# Patient Record
Sex: Female | Born: 1975
Health system: Southern US, Community
[De-identification: ages and names within clinical notes are randomized; demographics above are authoritative.]

## PROBLEM LIST (undated history)

## (undated) DIAGNOSIS — R002 Palpitations: Secondary | ICD-10-CM

## (undated) DIAGNOSIS — R000 Tachycardia, unspecified: Secondary | ICD-10-CM

## (undated) DIAGNOSIS — D649 Anemia, unspecified: Secondary | ICD-10-CM

## (undated) DIAGNOSIS — K219 Gastro-esophageal reflux disease without esophagitis: Secondary | ICD-10-CM

## (undated) DIAGNOSIS — T4145XA Adverse effect of unspecified anesthetic, initial encounter: Secondary | ICD-10-CM

## (undated) DIAGNOSIS — R112 Nausea with vomiting, unspecified: Secondary | ICD-10-CM

## (undated) DIAGNOSIS — Z9889 Other specified postprocedural states: Secondary | ICD-10-CM

## (undated) DIAGNOSIS — Z8489 Family history of other specified conditions: Secondary | ICD-10-CM

## (undated) DIAGNOSIS — T8859XA Other complications of anesthesia, initial encounter: Secondary | ICD-10-CM

## (undated) HISTORY — PX: ABDOMINAL HYSTERECTOMY: SHX81

## (undated) HISTORY — PX: OOPHORECTOMY: SHX86

---

## 2005-06-19 ENCOUNTER — Emergency Department: Payer: Self-pay | Admitting: Emergency Medicine

## 2005-07-20 ENCOUNTER — Ambulatory Visit: Payer: Self-pay

## 2005-08-08 ENCOUNTER — Observation Stay: Payer: Self-pay

## 2005-10-05 ENCOUNTER — Observation Stay: Payer: Self-pay | Admitting: Obstetrics & Gynecology

## 2005-10-10 ENCOUNTER — Inpatient Hospital Stay: Payer: Self-pay | Admitting: Obstetrics & Gynecology

## 2006-11-06 ENCOUNTER — Ambulatory Visit: Payer: Self-pay

## 2007-01-22 ENCOUNTER — Inpatient Hospital Stay: Payer: Self-pay

## 2015-01-15 ENCOUNTER — Encounter
Admission: RE | Admit: 2015-01-15 | Discharge: 2015-01-15 | Disposition: A | Discharge status: Home / Self Care | Payer: BLUE CROSS/BLUE SHIELD | Source: Ambulatory Visit | Attending: Obstetrics & Gynecology | Admitting: Obstetrics & Gynecology

## 2015-01-15 DIAGNOSIS — Z882 Allergy status to sulfonamides status: Secondary | ICD-10-CM | POA: Diagnosis not present

## 2015-01-15 DIAGNOSIS — Z880 Allergy status to penicillin: Secondary | ICD-10-CM | POA: Insufficient documentation

## 2015-01-15 DIAGNOSIS — Z3200 Encounter for pregnancy test, result unknown: Secondary | ICD-10-CM | POA: Insufficient documentation

## 2015-01-15 DIAGNOSIS — Z01812 Encounter for preprocedural laboratory examination: Secondary | ICD-10-CM | POA: Insufficient documentation

## 2015-01-15 DIAGNOSIS — Z881 Allergy status to other antibiotic agents status: Secondary | ICD-10-CM | POA: Insufficient documentation

## 2015-01-15 DIAGNOSIS — Z886 Allergy status to analgesic agent status: Secondary | ICD-10-CM | POA: Insufficient documentation

## 2015-01-15 LAB — ABO/RH: ABO/RH(D): A POS

## 2015-01-15 LAB — CBC
HCT: 33 % — ABNORMAL LOW (ref 35.0–47.0)
Hemoglobin: 10.4 g/dL — ABNORMAL LOW (ref 12.0–16.0)
MCH: 23.8 pg — ABNORMAL LOW (ref 26.0–34.0)
MCHC: 31.4 g/dL — ABNORMAL LOW (ref 32.0–36.0)
MCV: 75.9 fL — ABNORMAL LOW (ref 80.0–100.0)
PLATELETS: 308 10*3/uL (ref 150–440)
RBC: 4.35 MIL/uL (ref 3.80–5.20)
RDW: 16 % — ABNORMAL HIGH (ref 11.5–14.5)
WBC: 4.3 10*3/uL (ref 3.6–11.0)

## 2015-01-15 LAB — TYPE AND SCREEN
ABO/RH(D): A POS
Antibody Screen: NEGATIVE

## 2015-01-15 NOTE — Patient Instructions (Signed)
  Your procedure is scheduled on: Thursday January 29, 2015. Report to Same Day Surgery. To find out your arrival time please call [phone] between 1PM - 3PM on Wednesday July, 6 2016.  Remember: Instructions that are not followed completely may result in serious medical risk, up to and including death, or upon the discretion of your surgeon and anesthesiologist your surgery may need to be rescheduled.    _x___ 1. Do not eat food or drink liquids after midnight. No gum chewing or hard candies.     ____ 2. No Alcohol for 24 hours before or after surgery.   ____ 3. Bring all medications with you on the day of surgery if instructed.    _x___ 4. Notify your doctor if there is any change in your medical condition     (cold, fever, infections).     Do not wear jewelry, make-up, hairpins, clips or nail polish.  Do not wear lotions, powders, or perfumes. You may wear deodorant.  Do not shave 48 hours prior to surgery. Men may shave face and neck.  Do not bring valuables to the hospital.    [facility] is not responsible for any belongings or valuables.               Contacts, dentures or bridgework may not be worn into surgery.  Leave your suitcase in the car. After surgery it may be brought to your room.  For patients admitted to the hospital, discharge time is determined by your treatment team.   Patients discharged the day of surgery will not be allowed to drive home.    Please read over the following fact sheets that you were given:   [facility] Preparing for Surgery  __x__ Take these medicines the morning of surgery with A SIP OF WATER: None    ____ Fleet Enema (as directed)   __x__ Use CHG Soap as directed  ____ Use inhalers on the day of surgery  ____ Stop metformin 2 days prior to surgery    ____ Take 1/2 of usual insulin dose the night before surgery and none on the morning of surgery.   ____ Stop Coumadin/Plavix/aspirin on does not apply.  _x___ Stop  Anti-inflammatories (Ibuprofen) on 01/23/2015   ____ Stop supplements until after surgery.    ____ Bring C-Pap to the hospital.

## 2015-01-29 ENCOUNTER — Encounter
Admission: RE | Disposition: A | Discharge status: Home / Self Care | Payer: Self-pay | Source: Ambulatory Visit | Attending: Obstetrics & Gynecology

## 2015-01-29 ENCOUNTER — Ambulatory Visit: Payer: BLUE CROSS/BLUE SHIELD | Admitting: Anesthesiology

## 2015-01-29 ENCOUNTER — Encounter: Payer: Self-pay | Admitting: *Deleted

## 2015-01-29 ENCOUNTER — Observation Stay
Admission: RE | Admit: 2015-01-29 | Discharge: 2015-01-30 | Disposition: A | Discharge status: Home / Self Care | Payer: BLUE CROSS/BLUE SHIELD | Source: Ambulatory Visit | Attending: Obstetrics & Gynecology | Admitting: Obstetrics & Gynecology

## 2015-01-29 DIAGNOSIS — Z90710 Acquired absence of both cervix and uterus: Secondary | ICD-10-CM | POA: Diagnosis present

## 2015-01-29 DIAGNOSIS — R102 Pelvic and perineal pain: Secondary | ICD-10-CM | POA: Insufficient documentation

## 2015-01-29 DIAGNOSIS — N921 Excessive and frequent menstruation with irregular cycle: Secondary | ICD-10-CM | POA: Diagnosis present

## 2015-01-29 DIAGNOSIS — N800 Endometriosis of uterus: Principal | ICD-10-CM | POA: Insufficient documentation

## 2015-01-29 DIAGNOSIS — N838 Other noninflammatory disorders of ovary, fallopian tube and broad ligament: Secondary | ICD-10-CM | POA: Diagnosis not present

## 2015-01-29 DIAGNOSIS — N809 Endometriosis, unspecified: Secondary | ICD-10-CM | POA: Diagnosis present

## 2015-01-29 HISTORY — PX: LAPAROSCOPIC BILATERAL SALPINGECTOMY: SHX5889

## 2015-01-29 HISTORY — PX: CYSTOSCOPY: SHX5120

## 2015-01-29 HISTORY — PX: LAPAROSCOPIC HYSTERECTOMY: SHX1926

## 2015-01-29 LAB — POCT PREGNANCY, URINE: Preg Test, Ur: NEGATIVE

## 2015-01-29 SURGERY — HYSTERECTOMY, TOTAL, LAPAROSCOPIC
Anesthesia: General | Site: Bladder

## 2015-01-29 MED ORDER — ONDANSETRON HCL 4 MG/2ML IJ SOLN: 4.0000 mg | Freq: Once | INTRAMUSCULAR | Status: DC | PRN

## 2015-01-29 MED ORDER — KETOROLAC TROMETHAMINE 30 MG/ML IJ SOLN
30.0000 mg | Freq: Four times a day (QID) | INTRAMUSCULAR | Status: DC
  Administered 2015-01-29: 30 mg via INTRAVENOUS

## 2015-01-29 MED ORDER — OXYCODONE-ACETAMINOPHEN 5-325 MG PO TABS
1.0000 | ORAL_TABLET | ORAL | Status: DC | PRN
  Administered 2015-01-30 (×2): 1 via ORAL
  Filled 2015-01-29 (×2): qty 1

## 2015-01-29 MED ORDER — CLINDAMYCIN PHOSPHATE 900 MG/50ML IV SOLN
900.0000 mg | Freq: Once | INTRAVENOUS | Status: AC
  Administered 2015-01-29: 900 mg via INTRAVENOUS

## 2015-01-29 MED ORDER — BUPIVACAINE HCL (PF) 0.5 % IJ SOLN
INTRAMUSCULAR | Status: AC
  Filled 2015-01-29: qty 30

## 2015-01-29 MED ORDER — ACETAMINOPHEN 325 MG PO TABS: 650.0000 mg | ORAL_TABLET | ORAL | Status: DC | PRN

## 2015-01-29 MED ORDER — FAMOTIDINE 20 MG PO TABS
ORAL_TABLET | ORAL | Status: AC
  Administered 2015-01-29: 20 mg via ORAL
  Filled 2015-01-29: qty 1

## 2015-01-29 MED ORDER — CLINDAMYCIN PHOSPHATE 900 MG/50ML IV SOLN
INTRAVENOUS | Status: AC
  Administered 2015-01-29: 900 mg via INTRAVENOUS
  Filled 2015-01-29: qty 50

## 2015-01-29 MED ORDER — PROMETHAZINE HCL 25 MG/ML IJ SOLN
25.0000 mg | Freq: Four times a day (QID) | INTRAMUSCULAR | Status: DC | PRN
  Administered 2015-01-29: 25 mg via INTRAVENOUS
  Filled 2015-01-29: qty 1

## 2015-01-29 MED ORDER — FENTANYL CITRATE (PF) 100 MCG/2ML IJ SOLN
INTRAMUSCULAR | Status: AC
  Administered 2015-01-29: 25 ug via INTRAVENOUS
  Filled 2015-01-29: qty 2

## 2015-01-29 MED ORDER — HYDROMORPHONE HCL 1 MG/ML IJ SOLN
0.5000 mg | INTRAMUSCULAR | Status: DC | PRN
  Administered 2015-01-29 (×2): 0.5 mg via INTRAVENOUS

## 2015-01-29 MED ORDER — KETOROLAC TROMETHAMINE 30 MG/ML IJ SOLN
30.0000 mg | Freq: Four times a day (QID) | INTRAMUSCULAR | Status: AC
  Administered 2015-01-29 – 2015-01-30 (×3): 30 mg via INTRAVENOUS
  Filled 2015-01-29 (×3): qty 1

## 2015-01-29 MED ORDER — FENTANYL CITRATE (PF) 100 MCG/2ML IJ SOLN
INTRAMUSCULAR | Status: DC | PRN
  Administered 2015-01-29 (×2): 50 ug via INTRAVENOUS
  Administered 2015-01-29: 150 ug via INTRAVENOUS

## 2015-01-29 MED ORDER — KETOROLAC TROMETHAMINE 30 MG/ML IJ SOLN
INTRAMUSCULAR | Status: AC
  Filled 2015-01-29: qty 1

## 2015-01-29 MED ORDER — BUPIVACAINE HCL (PF) 0.5 % IJ SOLN
INTRAMUSCULAR | Status: DC | PRN
  Administered 2015-01-29: 8 mL

## 2015-01-29 MED ORDER — MIDAZOLAM HCL 2 MG/2ML IJ SOLN
INTRAMUSCULAR | Status: DC | PRN
  Administered 2015-01-29: 3 mg via INTRAVENOUS

## 2015-01-29 MED ORDER — HYDROMORPHONE HCL 1 MG/ML IJ SOLN
INTRAMUSCULAR | Status: AC
  Administered 2015-01-29: 0.5 mg via INTRAVENOUS
  Filled 2015-01-29: qty 1

## 2015-01-29 MED ORDER — ONDANSETRON HCL 4 MG PO TABS: 4.0000 mg | ORAL_TABLET | Freq: Four times a day (QID) | ORAL | Status: DC | PRN

## 2015-01-29 MED ORDER — FAMOTIDINE 20 MG PO TABS
20.0000 mg | ORAL_TABLET | Freq: Once | ORAL | Status: AC
  Administered 2015-01-29: 20 mg via ORAL

## 2015-01-29 MED ORDER — PROPOFOL 10 MG/ML IV BOLUS
INTRAVENOUS | Status: DC | PRN
  Administered 2015-01-29: 130 mg via INTRAVENOUS

## 2015-01-29 MED ORDER — FENTANYL CITRATE (PF) 100 MCG/2ML IJ SOLN
25.0000 ug | INTRAMUSCULAR | Status: DC | PRN
  Administered 2015-01-29 (×4): 25 ug via INTRAVENOUS

## 2015-01-29 MED ORDER — SUGAMMADEX SODIUM 200 MG/2ML IV SOLN
INTRAVENOUS | Status: DC | PRN
  Administered 2015-01-29: 145.2 mg via INTRAVENOUS

## 2015-01-29 MED ORDER — ONDANSETRON HCL 4 MG/2ML IJ SOLN
4.0000 mg | Freq: Four times a day (QID) | INTRAMUSCULAR | Status: DC | PRN
  Administered 2015-01-29: 4 mg via INTRAVENOUS
  Filled 2015-01-29: qty 2

## 2015-01-29 MED ORDER — SIMETHICONE 80 MG PO CHEW: 80.0000 mg | CHEWABLE_TABLET | Freq: Four times a day (QID) | ORAL | Status: DC | PRN

## 2015-01-29 MED ORDER — ONDANSETRON HCL 4 MG/2ML IJ SOLN
INTRAMUSCULAR | Status: DC | PRN
  Administered 2015-01-29: 4 mg via INTRAVENOUS

## 2015-01-29 MED ORDER — LACTATED RINGERS IV SOLN
INTRAVENOUS | Status: DC
  Administered 2015-01-29 (×3): via INTRAVENOUS

## 2015-01-29 MED ORDER — LACTATED RINGERS IV SOLN
INTRAVENOUS | Status: DC
  Administered 2015-01-29 (×2): via INTRAVENOUS

## 2015-01-29 MED ORDER — MORPHINE SULFATE 2 MG/ML IJ SOLN
1.0000 mg | INTRAMUSCULAR | Status: DC | PRN
  Administered 2015-01-29 (×2): 2 mg via INTRAVENOUS
  Filled 2015-01-29 (×2): qty 1

## 2015-01-29 MED ORDER — ROCURONIUM BROMIDE 100 MG/10ML IV SOLN
INTRAVENOUS | Status: DC | PRN
  Administered 2015-01-29: 40 mg via INTRAVENOUS
  Administered 2015-01-29: 10 mg via INTRAVENOUS

## 2015-01-29 SURGICAL SUPPLY — 46 items
APPLICATOR ARISTA FLEXITIP XL (MISCELLANEOUS) ×5 IMPLANT
ARISTA 1G HEMOSTATIC PARTICLES IMPLANT
BAG URO DRAIN 2000ML W/SPOUT (MISCELLANEOUS) ×5 IMPLANT
BLADE SURG SZ11 CARB STEEL (BLADE) ×5 IMPLANT
CANISTER SUCT 1200ML W/VALVE (MISCELLANEOUS) ×5 IMPLANT
CATH FOLEY 2WAY  5CC 16FR (CATHETERS) ×2
CATH URTH 16FR FL 2W BLN LF (CATHETERS) ×3 IMPLANT
CHLORAPREP W/TINT 26ML (MISCELLANEOUS) ×5 IMPLANT
DEFOGGER SCOPE WARMER CLEARIFY (MISCELLANEOUS) IMPLANT
DEVICE SUTURE ENDOST 10MM (ENDOMECHANICALS) ×5 IMPLANT
DRSG TEGADERM 2-3/8X2-3/4 SM (GAUZE/BANDAGES/DRESSINGS) IMPLANT
ENDOSTITCH 0 SINGLE 48 (SUTURE) ×35 IMPLANT
GAUZE SPONGE NON-WVN 2X2 STRL (MISCELLANEOUS) IMPLANT
GLOVE BIO SURGEON STRL SZ8 (GLOVE) ×35 IMPLANT
GLOVE INDICATOR 8.0 STRL GRN (GLOVE) ×10 IMPLANT
GOWN STRL REUS W/ TWL LRG LVL3 (GOWN DISPOSABLE) ×12 IMPLANT
GOWN STRL REUS W/ TWL XL LVL3 (GOWN DISPOSABLE) ×3 IMPLANT
GOWN STRL REUS W/TWL LRG LVL3 (GOWN DISPOSABLE) ×8
GOWN STRL REUS W/TWL XL LVL3 (GOWN DISPOSABLE) ×2
HEMOSTAT ARISTA ABSORB 1G (Miscellaneous) ×5 IMPLANT
IRRIGATION STRYKERFLOW (MISCELLANEOUS) ×3 IMPLANT
IRRIGATOR STRYKERFLOW (MISCELLANEOUS) ×5
IV LACTATED RINGERS 1000ML (IV SOLUTION) ×10 IMPLANT
KIT RM TURNOVER CYSTO AR (KITS) ×5 IMPLANT
LABEL OR SOLS (LABEL) ×5 IMPLANT
LIQUID BAND (GAUZE/BANDAGES/DRESSINGS) ×5 IMPLANT
MANIPULATOR VCARE LG CRV RETR (MISCELLANEOUS) ×5 IMPLANT
MANIPULATOR VCARE STD CRV RETR (MISCELLANEOUS) IMPLANT
NEEDLE VERESS 14GA 120MM (NEEDLE) ×5 IMPLANT
NS IRRIG 500ML POUR BTL (IV SOLUTION) ×5 IMPLANT
OCCLUDER COLPOPNEUMO (BALLOONS) ×5 IMPLANT
PACK GYN LAPAROSCOPIC (MISCELLANEOUS) ×5 IMPLANT
PAD OB MATERNITY 4.3X12.25 (PERSONAL CARE ITEMS) ×5 IMPLANT
PAD PREP 24X41 OB/GYN DISP (PERSONAL CARE ITEMS) ×5 IMPLANT
SCISSORS METZENBAUM CVD 33 (INSTRUMENTS) ×5 IMPLANT
SET CYSTO W/LG BORE CLAMP LF (SET/KITS/TRAYS/PACK) ×5 IMPLANT
SHEARS HARMONIC ACE PLUS 36CM (ENDOMECHANICALS) ×5 IMPLANT
SLEEVE ENDOPATH XCEL 5M (ENDOMECHANICALS) ×5 IMPLANT
SPONGE LAP 18X18 5 PK (GAUZE/BANDAGES/DRESSINGS) ×5 IMPLANT
SPONGE VERSALON 2X2 STRL (MISCELLANEOUS)
SUT VIC AB 2-0 UR6 27 (SUTURE) IMPLANT
SYR 50ML LL SCALE MARK (SYRINGE) ×5 IMPLANT
SYRINGE 10CC LL (SYRINGE) ×5 IMPLANT
TROCAR ENDO BLADELESS 11MM (ENDOMECHANICALS) ×5 IMPLANT
TROCAR XCEL NON-BLD 5MMX100MML (ENDOMECHANICALS) ×5 IMPLANT
TUBING INSUFFLATOR HEATED (MISCELLANEOUS) ×5 IMPLANT

## 2015-01-29 NOTE — H&P (Signed)
History and Physical Interval Note:  01/29/2015 7:18 AM  Elizabeth ChampagneErin L Shelton  has presented today for surgery, with the diagnosis of MENOMETRORRHAGIA AND ADENOMYOSIS  The various methods of treatment have been discussed with the patient and family. After consideration of risks, benefits and other options for treatment, the patient has consented to  Procedure(s): HYSTERECTOMY TOTAL LAPAROSCOPIC (N/A) LAPAROSCOPIC BILATERAL SALPINGECTOMY (Bilateral) as a surgical intervention .  The patient's history has been reviewed, patient examined, no change in status, stable for surgery.  Pt has the following beta blocker history-  Not taking Beta Blocker.  I have reviewed the patient's chart and labs.  Questions were answered to the patient's satisfaction.       Manny Vitolo Renae FicklePAUL

## 2015-01-29 NOTE — Anesthesia Procedure Notes (Signed)
Procedure Name: Intubation Date/Time: 01/29/2015 7:36 AM Performed by: Omer JackWEATHERLY, Elizabeth Olafson Pre-anesthesia Checklist: Patient identified, Patient being monitored, Timeout performed, Emergency Drugs available and Suction available Patient Re-evaluated:Patient Re-evaluated prior to inductionOxygen Delivery Method: Circle system utilized Preoxygenation: Pre-oxygenation with 100% oxygen Intubation Type: IV induction Ventilation: Mask ventilation without difficulty Laryngoscope Size: Mac and 3 Grade View: Grade I Tube type: Oral Tube size: 7.0 mm Number of attempts: 1 Placement Confirmation: ETT inserted through vocal cords under direct vision,  positive ETCO2 and breath sounds checked- equal and bilateral Secured at: 21 cm Tube secured with: Tape Dental Injury: Teeth and Oropharynx as per pre-operative assessment

## 2015-01-29 NOTE — Anesthesia Preprocedure Evaluation (Signed)
Anesthesia Evaluation  Patient identified by MRN, date of birth, ID band Patient awake    Reviewed: Allergy & Precautions, NPO status , Patient's Chart, lab work & pertinent test results  Airway Mallampati: II  TM Distance: >3 FB Neck ROM: Full    Dental no notable dental hx. (+) Caps Molar caps:   Pulmonary neg pulmonary ROS,  breath sounds clear to auscultation  Pulmonary exam normal       Cardiovascular negative cardio ROS Normal cardiovascular exam    Neuro/Psych negative neurological ROS  negative psych ROS   GI/Hepatic negative GI ROS, Neg liver ROS,   Endo/Other  negative endocrine ROS  Renal/GU negative Renal ROS  negative genitourinary   Musculoskeletal negative musculoskeletal ROS (+)   Abdominal Normal abdominal exam  (+)   Peds negative pediatric ROS (+)  Hematology negative hematology ROS (+)   Anesthesia Other Findings   Reproductive/Obstetrics negative OB ROS                             Anesthesia Physical Anesthesia Plan  ASA: I  Anesthesia Plan: General   Post-op Pain Management:    Induction: Intravenous  Airway Management Planned: Oral ETT  Additional Equipment:   Intra-op Plan:   Post-operative Plan: Extubation in OR  Informed Consent: I have reviewed the patients History and Physical, chart, labs and discussed the procedure including the risks, benefits and alternatives for the proposed anesthesia with the patient or authorized representative who has indicated his/her understanding and acceptance.   Dental advisory given  Plan Discussed with: CRNA and Surgeon  Anesthesia Plan Comments:         Anesthesia Quick Evaluation

## 2015-01-29 NOTE — Anesthesia Postprocedure Evaluation (Signed)
  Anesthesia Post-op Note  Patient: Elizabeth Shelton  Procedure(s) Performed: Procedure(s): HYSTERECTOMY TOTAL LAPAROSCOPIC (N/A) LAPAROSCOPIC BILATERAL SALPINGECTOMY (Bilateral) CYSTOSCOPY (N/A)  Anesthesia type:General  Patient location: PACU  Post pain: Pain level controlled  Post assessment: Post-op Vital signs reviewed, Patient's Cardiovascular Status Stable, Respiratory Function Stable, Patent Airway and No signs of Nausea or vomiting  Post vital signs: Reviewed and stable  Last Vitals:  Filed Vitals:   01/29/15 1409  BP: 105/62  Pulse: 61  Temp: 36.7 C  Resp: 16    Level of consciousness: awake, alert  and patient cooperative  Complications: No apparent anesthesia complications

## 2015-01-29 NOTE — Op Note (Signed)
Operative Note:  PRE-OP DIAGNOSIS: Pelvic pain and Menorrhagia   POST-OP DIAGNOSIS: same, with Endometriosis  PROCEDURE:   HYSTERECTOMY- TOTAL LAPAROSCOPIC with BILATERAL SALPINGECTOMY, CYSTOSCOPY  SURGEON: Annamarie Major, MD, FACOG  ASSISTANT: Dr Jean Rosenthal   ANESTHESIA: General endotracheal anesthesia  ESTIMATED BLOOD LOSS: less than 50   SPECIMENS: Uterus, Tubes.  COMPLICATIONS: None  DISPOSITION: stable to PACU  FINDINGS: Intraabdominal adhesions were noted. Left Endometrioma drained.  Endometriosis implants noted in bilateral posterior ovarian fossa.  Normal cystoscopy with bilateral ureteral flow seen.  PROCEDURE:  The patient was taken to the OR where anesthesia was administed. She was prepped and draped in the normal sterile fashion in the dorsal lithotomy position in the [place] stirrups. A time out was performed. A Graves speculum was inserted, the cervix was grasped with a single tooth tenaculum and the endometrial cavity was sounded.  A V-Care uterine manipulator was inserted in the usual fashion without incident. Gloves were changed and attention was turned to the abdomen.   An infraumbilical transverse 5mm skin incision was made with the scalpel after local anesthesia applied to the skin. A Veress-step needle was inserted in the usual fashion and confirmed using the hanging drop technique. A pneumoperitoneum was obtained by insufflation of CO2 (opening pressure of ) to . A diagnostic laparoscopy was performed yielding the previously described findings. Attention was turned to the left lower quadrant where after visualization of the inferior epigastric vessels a 5mm skin incision was made with the scalpel. A 5 mm laparoscopic port was inserted. The same procedure was repeated in the right lower quadrant with a 11mm trocar. Attention was turned to the left aspect of the uterus, where after visualization of the ureter, the round ligament was coagulated and transected using  the 5mm Harmonic Scapel. The anterior and posterior leafs of the broad ligament were dissected off as the anterior one was coagulated and transected in a caudal direction towards the cuff of the uterine manipulator. The vesicouterine reflection of the peritoneum was dissected with the harmonic scapel and the bladder flap was created bluntly. Attention was then turned to the left fallopian tube which was recognized by visualization of the fimbria. First the mesosalpinx and then the utero-ovarian ligament were coagulated and transected using the Harmonic Scapel. Attention was turned to the right aspect of the uterus where the same procedure was performed. The uterine vessels were sealed and transected bilaterally using first bipolar cautery and then the harmonic scapel. A 360 degree, circumferential colpotomy was done to completely amputate the uterus with cervix and tubes. Once the specimen was amputated it was delivered through the vagina. Some adehioslysis was performed during this process to free left ovary away from sidewall and uterus; ureter was traced and observed during this process.  The colpotomy was repaired in a simple interrupted fashion using a 0-Polysorb suture with an endo-stitch device.  Vaginal exam confirms complete closure.  The cavity was copiously irrigated. A survey of the pelvic cavity revealed adequate hemostasis and no injury to bowel, bladder, or ureter.  Arista was utilized to help obtain hemostasis.  A diagnostic cystoscopy was performed using saline distension of bladder, with bilateral urine flow from each ureteral orifice.  No bladder lesions or injuries seen.  Foley cathater replaced.   At this point the procedure was finalized. All the instruments were removed from the patient's body. Gas was expelled and patient is leveled.  Incisions are closed with skin adhesive.    Patient goes to recovery room in stable condition.  All sponge, instrument, and needle counts are correct  x2.

## 2015-01-29 NOTE — Transfer of Care (Signed)
Immediate Anesthesia Transfer of Care Note  Patient: Elizabeth Shelton  Procedure(s) Performed: Procedure(s): HYSTERECTOMY TOTAL LAPAROSCOPIC (N/A) LAPAROSCOPIC BILATERAL SALPINGECTOMY (Bilateral)  Patient Location: PACU  Anesthesia Type:General  Level of Consciousness: awake, sedated and patient cooperative  Airway & Oxygen Therapy: Patient Spontanous Breathing and Patient connected to face mask oxygen  Post-op Assessment: Report given to RN and Post -op Vital signs reviewed and stable  Post vital signs: Reviewed and stable  Last Vitals:  Filed Vitals:   01/29/15 0957  BP: 95/78  Pulse: 71  Temp: 36.6 C  Resp: 21    Complications: No apparent anesthesia complications

## 2015-01-30 ENCOUNTER — Encounter: Payer: Self-pay | Admitting: Obstetrics & Gynecology

## 2015-01-30 DIAGNOSIS — N800 Endometriosis of uterus: Secondary | ICD-10-CM | POA: Diagnosis not present

## 2015-01-30 LAB — SURGICAL PATHOLOGY

## 2015-01-30 LAB — HEMOGLOBIN: Hemoglobin: 8.8 g/dL — ABNORMAL LOW (ref 12.0–16.0)

## 2015-01-30 MED ORDER — OXYCODONE-ACETAMINOPHEN 5-325 MG PO TABS: 1.0000 | ORAL_TABLET | ORAL | Status: DC | PRN

## 2015-01-30 NOTE — Discharge Summary (Signed)
  Gynecology Physician Postoperative Discharge Summary  Patient ID: Elizabeth Shelton L Cai MRN: [id number] DOB/AGE: [birth year] 39 y.o.  Admit Date: 01/29/2015 Discharge Date: 01/30/2015  Preoperative Diagnoses: Menorrhagia, Pelvic Pain Discharge Diagnosis:  Same and Endometriosis  Procedures: Procedure(s) (LRB): HYSTERECTOMY TOTAL LAPAROSCOPIC (N/A) LAPAROSCOPIC BILATERAL SALPINGECTOMY (Bilateral) CYSTOSCOPY (N/A)  Significant Labs: CBC Latest Ref Rng 01/30/2015 01/15/2015  WBC 3.6 - 11.0 K/uL - 4.3  Hemoglobin 12.0 - 16.0 g/dL 4.0(J8.8(L) 10.4(L)  Hematocrit 35.0 - 47.0 % - 33.0(L)  Platelets 150 - 440 K/uL - 308    Hospital Course:  Elizabeth Shelton L Baskin is a 39 y.o. No obstetric history on file.  admitted for scheduled surgery.  She underwent the procedures as mentioned above, her operation was uncomplicated. For further details about surgery, please refer to the operative report. Patient had an uncomplicated postoperative course. By time of discharge on POD#1, her pain was controlled on oral pain medications; she was ambulating, voiding without difficulty, tolerating regular diet and passing flatus. She was deemed stable for discharge to home.   Discharge Exam: Blood pressure 108/78, pulse 78, temperature 98.7 F (37.1 C), temperature source Oral, resp. rate 18, height 5\' 5"  (1.651 m), weight 72.576 kg (160 lb), SpO2 99 %. General appearance: alert and no distress  Resp: clear to auscultation bilaterally  Cardio: regular rate and rhythm  GI: soft, non-tender; bowel sounds normal; no masses, no organomegaly.  Incision: C/D/I, no erythema, no drainage noted Pelvic: scant blood on pad  Extremities: extremities normal, atraumatic, no cyanosis or edema and Homans sign is negative, no sign of DVT  Discharged Condition: Stable  Disposition: Home, activity restrictions discussed.     Medication List    TAKE these medications        ibuprofen 200 MG tablet  Commonly known as:  ADVIL,MOTRIN  Take  600 mg by mouth every 6 (six) hours as needed for headache. 600-800 mg as needed for head ache.     oxyCODONE-acetaminophen 5-325 MG per tablet  Commonly known as:  PERCOCET/ROXICET  Take 1-2 tablets by mouth every 4 (four) hours as needed (moderate to severe pain (when tolerating fluids)).         Annamarie MajorPaul Lindi Abram, MD

## 2015-01-30 NOTE — Discharge Instructions (Signed)
Total Laparoscopic Hysterectomy A total laparoscopic hysterectomy is a minimally invasive surgery to remove your uterus and cervix. This surgery is performed by making several small cuts (incisions) in your abdomen. It can also be done with a thin, lighted tube (laparoscope) inserted into two small incisions in your lower abdomen. Your fallopian tubes and ovaries can be removed (bilateral salpingo-oophorectomy) during this surgery as well.Benefits of minimally invasive surgery include:  Less pain.  Less risk of blood loss.  Less risk of infection.  Quicker return to normal activities. LET [facility] CARE PROVIDER KNOW ABOUT:  Any allergies you have.  All medicines you are taking, including vitamins, herbs, eye drops, creams, and over-the-counter medicines.  Previous problems you or members of your family have had with the use of anesthetics.  Any blood disorders you have.  Previous surgeries you have had.  Medical conditions you have. RISKS AND COMPLICATIONS  Generally, this is a safe procedure. However, as with any procedure, complications can occur. Possible complications include:  Bleeding.  Blood clots in the legs or lung.  Infection.  Injury to surrounding organs.  Problems with anesthesia.  Early menopause symptoms (hot flashes, night sweats, insomnia).  Risk of conversion to an open abdominal incision. BEFORE THE PROCEDURE  Ask your health care provider about changing or stopping your regular medicines.  Do not take aspirin or blood thinners (anticoagulants) for 1 week before the surgery or as told by your health care provider.  Do not eat or drink anything for 8 hours before the surgery or as told by your health care provider.  Quit smoking if you smoke.  Arrange for a ride home after surgery and for someone to help you at home during recovery. PROCEDURE   You will be given antibiotic medicine.  An IV tube will be placed in your arm. You will be given  medicine to make you sleep (general anesthetic).  A gas (carbon dioxide) will be used to inflate your abdomen. This will allow your surgeon to look inside your abdomen, perform your surgery, and treat any other problems found if necessary.  Three or four small incisions (often less than 1/2 inch) will be made in your abdomen. One of these incisions will be made in the area of your belly button (navel). The laparoscope will be inserted into the incision. Your surgeon will look through the laparoscope while doing your procedure.  Other surgical instruments will be inserted through the other incisions.  Your uterus may be removed through your vagina or cut into small pieces and removed through the small incisions.  Your incisions will be closed. AFTER THE PROCEDURE  The gas will be released from inside your abdomen.  You will be taken to the recovery area where a nurse will watch and check your progress. Once you are awake, stable, and taking fluids well, without other problems, you will return to your room or be allowed to go home.  There is usually minimal discomfort following the surgery because the incisions are so small.  You will be given pain medicine while you are in the hospital and for when you go home.  Total Laparoscopic Hysterectomy, Care After Refer to this sheet in the next few weeks. These instructions provide you with information on caring for yourself after your procedure. Your health care provider may also give you more specific instructions. Your treatment has been planned according to current medical practices, but problems sometimes occur. Call your health care provider if you have any problems or  questions after your procedure. WHAT TO EXPECT AFTER THE PROCEDURE  Pain and bruising at the incision sites. You will be given pain medicine to control it.  Menopausal symptoms such as hot flashes, night sweats, and insomnia if your ovaries were removed.  Sore throat from the  breathing tube that was inserted during surgery. HOME CARE INSTRUCTIONS  Only take over-the-counter or prescription medicines for pain, discomfort, or fever as directed by your health care provider.   Do not take aspirin. It can cause bleeding.   Do not drive for one week or when taking pain medicine.   No heavy lifting or strenuous activity for the next 4 weeks. Gradually resume normal activity.    Resume your usual diet as directed and allowed.   Get plenty of rest and sleep.   Do not douche, use tampons, or have sexual intercourse for at least 6 weeks.    Monitor your temperature and notify your health care provider of a fever.   Take showers instead of baths for 2-3 weeks.   Do not drink alcohol until your health care provider gives you permission.   If you develop constipation, you may take a mild laxative with your health care provider's permission. Bran foods may help with constipation problems. Drinking enough fluids to keep your urine clear or pale yellow may help as well.   Try to have someone home with you for 1-2 weeks to help around the house.   Keep all of your follow-up appointments as directed by your health care provider.  SEEK MEDICAL CARE IF:  You have swelling, redness, or increasing pain around your incision sites.   You have pus coming from your incision.   You notice a bad smell coming from your incision.   Your incision breaks open.   You feel dizzy or lightheaded.   You have pain or bleeding when you urinate.   You have persistent diarrhea.   You have persistent nausea and vomiting.   You have abnormal vaginal discharge.   You have a rash.   You have any type of abnormal reaction or develop an allergy to your medicine.   You have poor pain control with your prescribed medicine.  SEEK IMMEDIATE MEDICAL CARE IF:  You have chest pain or shortness of breath.  You have severe abdominal pain that is not relieved  with pain medicine.  You have pain or swelling in your legs. MAKE SURE YOU:  Understand these instructions.  Will watch your condition.  Will get help right away if you are not doing well or get worse.

## 2015-01-30 NOTE — Progress Notes (Signed)
Vital signs stable, pain controlled on PO meds, vaginal bleeding minimal, tolerating food and drink, voiding appropriately. Patient given prescription and discharge instructions and verbalized understanding. Discharged to home, escorted by auxilliary.

## 2015-02-23 DIAGNOSIS — F410 Panic disorder [episodic paroxysmal anxiety] without agoraphobia: Secondary | ICD-10-CM | POA: Insufficient documentation

## 2016-02-26 ENCOUNTER — Ambulatory Visit (INDEPENDENT_AMBULATORY_CARE_PROVIDER_SITE_OTHER): Payer: BLUE CROSS/BLUE SHIELD | Admitting: Sports Medicine

## 2016-02-26 ENCOUNTER — Telehealth: Payer: Self-pay | Admitting: *Deleted

## 2016-02-26 ENCOUNTER — Encounter: Payer: Self-pay | Admitting: Sports Medicine

## 2016-02-26 DIAGNOSIS — B351 Tinea unguium: Secondary | ICD-10-CM

## 2016-02-26 DIAGNOSIS — M79675 Pain in left toe(s): Secondary | ICD-10-CM

## 2016-02-26 DIAGNOSIS — M79674 Pain in right toe(s): Secondary | ICD-10-CM | POA: Diagnosis not present

## 2016-02-26 MED ORDER — TAVABOROLE 5 % EX SOLN: 1.0000 [drp] | Freq: Every day | CUTANEOUS | 11 refills | Status: DC

## 2016-02-26 NOTE — Telephone Encounter (Signed)
-----   Message from Asencion Islam, [place] sent at 02/26/2016 10:27 AM EDT ----- Regarding: Elizabeth Shelton for nail fungus Hi Joya San can we rx Kerydin Nail fungus Thanks Dr. Marylene Land

## 2016-02-26 NOTE — Progress Notes (Signed)
Subjective: Elizabeth Shelton is a 40 y.o. female patient seen today in office with complaint of painful thickened and discolored nails. Patient is desiring treatment for nail changes; has tried Lamisil > 1year ago with pulse dosing with Dr. Al Corpus after + fungal culture and laser; Reports that nails are becoming difficult to manage because of the thickness. Desire repeat medication treatment. Patient has no other pedal complaints at this time.   Patient Active Problem List   Diagnosis Date Noted  . Panic attack 02/23/2015  . Endometriosis determined by laparoscopy 01/29/2015  . S/P hysterectomy 01/29/2015    Current Outpatient Prescriptions on File Prior to Visit  Medication Sig Dispense Refill  . ibuprofen (ADVIL,MOTRIN) 200 MG tablet Take 600 mg by mouth every 6 (six) hours as needed for headache. 600-800 mg as needed for head ache.    . oxyCODONE-acetaminophen (PERCOCET/ROXICET) 5-325 MG per tablet Take 1-2 tablets by mouth every 4 (four) hours as needed (moderate to severe pain (when tolerating fluids)). 50 tablet 0   No current facility-administered medications on file prior to visit.     Allergies  Allergen Reactions  . Amoxicillin Hives  . Aspirin Hives  . Sulfa Antibiotics Other (See Comments)    Joint pain  Intense joint pain Severe joint pain  . Adhesive [Tape] Other (See Comments)    Blisters skin and takes off top layer of skin when tape is removed.  . Codeine Hives  . Penicillins Hives    Objective: Physical Exam  General: Well developed, nourished, no acute distress, awake, alert and oriented x 3  Vascular: Dorsalis pedis artery 2/4 bilateral, Posterior tibial artery 2/4 bilateral, skin temperature warm to warm proximal to distal bilateral lower extremities, no varicosities, pedal hair present bilateral.  Neurological: Gross sensation present via light touch bilateral.   Dermatological: Skin is warm, dry, and supple bilateral, Nails 1-10 are tender, short thick,  and discolored with mild subungal debris, no webspace macerations present bilateral, no open lesions present bilateral, no callus/corns/hyperkeratotic tissue present bilateral. No signs of infection bilateral.  Musculoskeletal: No boney deformities noted bilateral. Muscular strength within normal limits without painon range of motion. No pain with calf compression bilateral.  Assessment and Plan:  Problem List Items Addressed This Visit    None    Visit Diagnoses    Nail fungal infection    -  Primary   Relevant Orders   Hepatic Function Panel   Toe pain, bilateral          -Examined patient -Discussed treatment options for painful dystrophic nails  -Rx Kerydin and LFTs for consideration of 3 month Lamisil pulse dosing; will call patient after labs are reviewed to start lamisil orally -Advised good hygiene habits -Patient to return in 12 weeks for follow up evaluation after starting lamisil or sooner if symptoms worsen.  Asencion Islam, DPM

## 2016-03-03 DIAGNOSIS — B351 Tinea unguium: Secondary | ICD-10-CM | POA: Diagnosis not present

## 2016-03-04 LAB — HEPATIC FUNCTION PANEL
ALK PHOS: 75 IU/L (ref 39–117)
ALT: 8 IU/L (ref 0–32)
AST: 11 IU/L (ref 0–40)
Albumin: 4.2 g/dL (ref 3.5–5.5)
Bilirubin Total: 0.3 mg/dL (ref 0.0–1.2)
Bilirubin, Direct: 0.08 mg/dL (ref 0.00–0.40)
Total Protein: 6.7 g/dL (ref 6.0–8.5)

## 2016-03-07 NOTE — Telephone Encounter (Addendum)
-----   Message from Asencion Islamitorya [place], [place] sent at 03/04/2016  7:24 AM EDT ----- Can you call patient to let her know that her liver functions tests are normal and send to her pharmacy Lamisil 250mg  po daily, disp 30 with 2 refills (12 week course). Thanks Dr. Marylene LandStover 03/07/2016-Left message informing pt of Dr. Wynema BirchStover's orders.

## 2016-03-09 ENCOUNTER — Telehealth: Payer: Self-pay | Admitting: Sports Medicine

## 2016-03-09 ENCOUNTER — Other Ambulatory Visit: Payer: Self-pay | Admitting: Sports Medicine

## 2016-03-09 DIAGNOSIS — B351 Tinea unguium: Secondary | ICD-10-CM

## 2016-03-09 MED ORDER — TERBINAFINE HCL 250 MG PO TABS: 250.0000 mg | ORAL_TABLET | Freq: Every day | ORAL | 2 refills | Status: DC

## 2016-03-09 MED ORDER — TAVABOROLE 5 % EX SOLN: 1.0000 [drp] | Freq: Every day | CUTANEOUS | 11 refills | Status: DC

## 2016-03-09 NOTE — Telephone Encounter (Signed)
Reviewed and Sent Lamisil to pharmacy -Dr. Marylene LandStover

## 2016-03-09 NOTE — Telephone Encounter (Signed)
Left message informing pt the Jodi GeraldsKerydin needed [place] be [place] the [place] pharmacy for prior [place] and that if not covered by insurance we would be able to get heer started with a topical compound for fungal nails from [facility] [place].

## 2016-03-09 NOTE — Telephone Encounter (Signed)
Patient had fungal culture performed by Dr. Al CorpusHyatt before we went electronic. If Elizabeth Shelton is denied we can offer her topical from [facility]. Also as for the Lamisil pills, patient was suppose to get bloodwork done 1st before I start her on the pills. Last office visit I told patient to get blood work done and then I will review it and if normal order the Lamisil pills. -Dr. Marylene LandStover

## 2016-03-09 NOTE — Telephone Encounter (Signed)
Pt was told that lamasil would be called into her pharmacy and the liquid topical medicine would be sent through mail order. When pt got to the pharmacy([facility] [address] in [place]) they only had the liquid topical rx. Please advise pt

## 2016-03-09 NOTE — Telephone Encounter (Signed)
Pt called in again to return your phone call. She states she has all of her meds that was send EXCEPT the lamasil. Her Pharmacy didn't have a RX for it. Can you send that to her pharmacy please

## 2016-03-09 NOTE — Progress Notes (Signed)
Hepatic panel normal sent lamisil to pharmacy.  -Dr Marylene LandStover

## 2016-03-10 ENCOUNTER — Other Ambulatory Visit: Payer: Self-pay | Admitting: Sports Medicine

## 2016-03-10 DIAGNOSIS — B351 Tinea unguium: Secondary | ICD-10-CM

## 2016-03-10 MED ORDER — TERBINAFINE HCL 250 MG PO TABS: 250.0000 mg | ORAL_TABLET | Freq: Every day | ORAL | 2 refills | Status: DC

## 2016-03-10 NOTE — Progress Notes (Signed)
[facility] [address]

## 2016-05-30 DIAGNOSIS — J069 Acute upper respiratory infection, unspecified: Secondary | ICD-10-CM | POA: Diagnosis not present

## 2016-05-30 DIAGNOSIS — Z20818 Contact with and (suspected) exposure to other bacterial communicable diseases: Secondary | ICD-10-CM | POA: Diagnosis not present

## 2016-05-30 DIAGNOSIS — J029 Acute pharyngitis, unspecified: Secondary | ICD-10-CM | POA: Diagnosis not present

## 2016-06-01 ENCOUNTER — Ambulatory Visit (INDEPENDENT_AMBULATORY_CARE_PROVIDER_SITE_OTHER): Payer: BLUE CROSS/BLUE SHIELD | Admitting: Podiatry

## 2016-06-01 ENCOUNTER — Encounter: Payer: Self-pay | Admitting: Podiatry

## 2016-06-01 DIAGNOSIS — B351 Tinea unguium: Secondary | ICD-10-CM | POA: Diagnosis not present

## 2016-06-01 NOTE — Progress Notes (Signed)
She presents today starting her third dose of Lamisil and she is also using Kerydin. She states that she's noticed dramatic improvement with exception of the third nail plate left.  Objective: Vital signs are stable alert and oriented 3. Pulses are palpable. Nail plates are 69.6%99.9% grown out with exception of the third nail plate left which appears to be dystrophic with ridges and lumps in the nail plate and discoloration. She states that the nail hardly grows.  Assessment: Well-healing onychomycosis.  Plan: Continue the remainder of the month to be her third month of Lamisil therapy and she will continue the topical therapy as well I will follow up with her in 3 months and consider other therapies.

## 2016-06-07 DIAGNOSIS — S060X0A Concussion without loss of consciousness, initial encounter: Secondary | ICD-10-CM | POA: Diagnosis not present

## 2016-08-26 DIAGNOSIS — J019 Acute sinusitis, unspecified: Secondary | ICD-10-CM | POA: Diagnosis not present

## 2016-09-05 ENCOUNTER — Ambulatory Visit: Payer: BLUE CROSS/BLUE SHIELD | Admitting: Podiatry

## 2016-09-16 ENCOUNTER — Ambulatory Visit (INDEPENDENT_AMBULATORY_CARE_PROVIDER_SITE_OTHER): Payer: BLUE CROSS/BLUE SHIELD | Admitting: Podiatry

## 2016-09-16 DIAGNOSIS — L603 Nail dystrophy: Secondary | ICD-10-CM

## 2016-09-23 NOTE — Progress Notes (Signed)
   Subjective:   Patient presents today for follow-up evaluation and treatment of possible fungal nail to the third digit left foot. Patient states that approximately 6 years ago she had severe trauma related to the third digit left foot which time she noticed discoloration and dystrophy of her toenail. Patient has tried to treat the nail with oral terbinafine 3 months. She is also using topical. She states that the topical is improved the appearance of the nail minimally and the oral antifungal did not improve the symptoms or parents.    Objective/Physical Exam General: The patient is alert and oriented x3 in no acute distress.  Dermatology: Thickened, dystrophic nail noted third digit left foot.  Vascular: Palpable pedal pulses bilaterally. No edema or erythema noted. Capillary refill within normal limits.  Neurological: Epicritic and protective threshold grossly intact bilaterally.   Musculoskeletal Exam: Range of motion within normal limits to all pedal and ankle joints bilateral. Muscle strength 5/5 in all groups bilateral.   Assessment: #1 dystrophic nail likely due to mechanical trauma third digit left foot  Plan of Care:  #1 Patient was evaluated. #2 today we are discussion regarding fungus versus mechanical trauma which can lead to nail dystrophy. I explained the patient that her situation is likely due to permanent nail trauma. #3 return to clinic when necessary   Felecia ShellingBrent M. Jaiyden Laur, DPM [facility]  Dr. Felecia ShellingBrent M. Samauri Kellenberger, DPM    [address]                                        [place], [place] [address]                Office [phone]  Fax [phone]

## 2017-02-10 ENCOUNTER — Ambulatory Visit: Payer: Self-pay | Admitting: Family Medicine

## 2018-02-12 ENCOUNTER — Ambulatory Visit (INDEPENDENT_AMBULATORY_CARE_PROVIDER_SITE_OTHER): Payer: BLUE CROSS/BLUE SHIELD | Admitting: Obstetrics & Gynecology

## 2018-02-12 ENCOUNTER — Encounter: Payer: Self-pay | Admitting: Obstetrics & Gynecology

## 2018-02-12 VITALS — BP 120/70 | Ht 64.0 in | Wt 163.0 lb

## 2018-02-12 DIAGNOSIS — R1032 Left lower quadrant pain: Secondary | ICD-10-CM

## 2018-02-12 DIAGNOSIS — M545 Low back pain, unspecified: Secondary | ICD-10-CM

## 2018-02-12 NOTE — Progress Notes (Signed)
Gynecology Pelvic Pain Evaluation   Chief Complaint:  Chief Complaint  Patient presents with  . Ovarian Cyst    left side pain   History of Present Illness:   Patient is a 42 y.o. [id number] who LMP was No LMP recorded (lmp unknown). Patient has had a hysterectomy., presents today for a problem visit.  She complains of pain.   Her pain is localized to the LLQ area, described as intermittent, stabbing and aching, began a week ago and its severity is described as moderate. The pain radiates to the  left back. She has these associated symptoms which include none. Denies bloating, changes in weight, nausea.  Patient has these modifiers which include nothing that make it better and unable to associate with any factor that make it worse.  Context includes: This occurred after a few days of GI frequency and discomfort; no further GI pains oe diarrhea or fever now.  Past history includes TLH BS 2016 for heavy periods, no endometriosis found then.  She does have a prior h/o ovarian cysts.  No menopause sx's.  PMHx: She  has a past medical history of Medical history non-contributory. Also,  has a past surgical history that includes No past surgeries; Laparoscopic hysterectomy (N/A, 01/29/2015); Laparoscopic bilateral salpingectomy (Bilateral, 01/29/2015); and Cystoscopy (N/A, 01/29/2015)., family history includes Hypercholesterolemia (age of onset: 4656) in her mother; Irritable bowel syndrome in her sister.,  reports that she has never smoked. She has never used smokeless tobacco. She reports that she does not drink alcohol or use drugs.  She has a current medication list which includes the following prescription(s): ibuprofen, oxycodone-acetaminophen, tavaborole, and terbinafine. Also, is allergic to amoxicillin; aspirin; codeine; sulfa antibiotics; adhesive [tape]; and penicillins.  Review of Systems  Constitutional: Negative for chills, fever and malaise/fatigue.  HENT: Negative for congestion, sinus pain and  sore throat.   Eyes: Negative for blurred vision and pain.  Respiratory: Negative for cough and wheezing.   Cardiovascular: Negative for chest pain and leg swelling.  Gastrointestinal: Negative for abdominal pain, constipation, diarrhea, heartburn, nausea and vomiting.  Genitourinary: Negative for dysuria, frequency, hematuria and urgency.  Musculoskeletal: Negative for back pain, joint pain, myalgias and neck pain.  Skin: Negative for itching and rash.  Neurological: Negative for dizziness, tremors and weakness.  Endo/Heme/Allergies: Does not bruise/bleed easily.  Psychiatric/Behavioral: Negative for depression. The patient is not nervous/anxious and does not have insomnia.     Objective: BP 120/70   Ht 5\' 4"  (1.626 m)   Wt 163 lb (73.9 kg)   LMP  (LMP Unknown)   BMI 27.98 kg/m  Physical Exam  Constitutional: She is oriented to person, place, and time. She appears well-developed and well-nourished. No distress.  Genitourinary: Rectum normal, vagina normal and uterus normal. Pelvic exam was performed with patient supine. There is no rash or lesion on the right labia. There is no rash or lesion on the left labia. Vagina exhibits no lesion. No bleeding in the vagina. Right adnexum does not display mass and does not display tenderness. Left adnexum does not display mass and does not display tenderness. Cervix does not exhibit motion tenderness, lesion, friability or polyp.   Uterus is mobile and midaxial. Uterus is not enlarged or exhibiting a mass.  HENT:  Head: Normocephalic and atraumatic. Head is without laceration.  Right Ear: Hearing normal.  Left Ear: Hearing normal.  Nose: No epistaxis.  No foreign bodies.  Mouth/Throat: Uvula is midline, oropharynx is clear and moist and mucous membranes are  normal.  Eyes: Pupils are equal, round, and reactive to light.  Neck: Normal range of motion. Neck supple. No thyromegaly present.  Cardiovascular: Normal rate and regular rhythm. Exam  reveals no gallop and no friction rub.  No murmur heard. Pulmonary/Chest: Effort normal and breath sounds normal. No respiratory distress. She has no wheezes.  Abdominal: Soft. Bowel sounds are normal. She exhibits no distension. There is no tenderness. There is no rebound.  Musculoskeletal: Normal range of motion.  Neurological: She is alert and oriented to person, place, and time. No cranial nerve deficit.  Skin: Skin is warm and dry.  Psychiatric: She has a normal mood and affect. Judgment normal.  Vitals reviewed.  Female chaperone present for pelvic portion of the physical exam  Assessment: 42 y.o. [phone] with LLQ pain, Possible left ovarian cyst, possible adhesions or endometriosis, possible diverticulitis or other GI etiology.  1. LLQ pain - US PELVIS TRANSVANGINAL NON-OB (TV ONLY); Future  2. Left-sided low back pain without sciatica, unspecified chronicity - US PELVIS TRANSVANGINAL NON-OB (TV ONLY); Future  3. NSAIDs for now for pain mgt  Annamarie Major, MD, [facility], [facility] 02/12/2018  3:31 PM

## 2018-02-19 ENCOUNTER — Ambulatory Visit (INDEPENDENT_AMBULATORY_CARE_PROVIDER_SITE_OTHER): Payer: BLUE CROSS/BLUE SHIELD | Admitting: Obstetrics & Gynecology

## 2018-02-19 ENCOUNTER — Encounter: Payer: Self-pay | Admitting: Obstetrics & Gynecology

## 2018-02-19 ENCOUNTER — Ambulatory Visit (INDEPENDENT_AMBULATORY_CARE_PROVIDER_SITE_OTHER): Payer: BLUE CROSS/BLUE SHIELD

## 2018-02-19 VITALS — BP 120/80 | Ht 64.0 in | Wt 162.0 lb

## 2018-02-19 DIAGNOSIS — M545 Low back pain, unspecified: Secondary | ICD-10-CM

## 2018-02-19 DIAGNOSIS — N83292 Other ovarian cyst, left side: Secondary | ICD-10-CM

## 2018-02-19 DIAGNOSIS — N83202 Unspecified ovarian cyst, left side: Secondary | ICD-10-CM | POA: Insufficient documentation

## 2018-02-19 DIAGNOSIS — R1032 Left lower quadrant pain: Secondary | ICD-10-CM

## 2018-02-19 NOTE — Progress Notes (Signed)
   PRE-OPERATIVE HISTORY AND PHYSICAL EXAM  HPI:  Elizabeth Shelton is a 41 y.o. G3P3003 No LMP recorded (lmp unknown). Patient has had a hysterectomy.; she is being admitted for surgery related to pelvic pain.  [name] has had LLQ pain for 2 weeks with ultrasound reveling a 5.4 cm complex left ovarian cyst.  Pt has had prior hysterectomy.  Pt has LLQ pain that radiates to back and is intermittant but severe when it occurs, rest makes better and US and sex making worse.  Has had pain off an on on left for many years.  PMHx: Past Medical History:  Diagnosis Date  . Medical history non-contributory    Past Surgical History:  Procedure Laterality Date  . CYSTOSCOPY N/A 01/29/2015   Procedure: CYSTOSCOPY;  Surgeon: Raechal Raben P Leonora Gores, MD;  Location: [facility];  Service: Gynecology;  Laterality: N/A;  . LAPAROSCOPIC BILATERAL SALPINGECTOMY Bilateral 01/29/2015   Procedure: LAPAROSCOPIC BILATERAL SALPINGECTOMY;  Surgeon: Britnie Colville P Kiki Bivens, MD;  Location: [facility];  Service: Gynecology;  Laterality: Bilateral;  . LAPAROSCOPIC HYSTERECTOMY N/A 01/29/2015   Procedure: HYSTERECTOMY TOTAL LAPAROSCOPIC;  Surgeon: Altagracia Rone P Jerell Demery, MD;  Location: [facility];  Service: Gynecology;  Laterality: N/A;  . NO PAST SURGERIES     Family History  Problem Relation Age of Onset  . Hypercholesterolemia Mother 56  . Irritable bowel syndrome Sister    Social History   Tobacco Use  . Smoking status: Never Smoker  . Smokeless tobacco: Never Used  Substance Use Topics  . Alcohol use: No  . Drug use: No    Current Outpatient Medications:  .  ibuprofen (ADVIL,MOTRIN) 200 MG tablet, Take 600 mg by mouth every 6 (six) hours as needed for headache. 600-800 mg as needed for head ache., Disp: , Rfl:  .  oxyCODONE-acetaminophen (PERCOCET/ROXICET) 5-325 MG per tablet, Take 1-2 tablets by mouth every 4 (four) hours as needed (moderate to severe pain (when tolerating fluids)). (Patient not taking: Reported on 02/12/2018), Disp: 50 tablet,  Rfl: 0 .  Tavaborole (KERYDIN) 5 % SOLN, Apply 1 drop topically daily. (Patient not taking: Reported on 02/12/2018), Disp: 1 Bottle, Rfl: 11 .  terbinafine (LAMISIL) 250 MG tablet, Take 1 tablet (250 mg total) by mouth daily. (Patient not taking: Reported on 02/12/2018), Disp: 30 tablet, Rfl: 2 Allergies: Amoxicillin; Aspirin; Codeine; Sulfa antibiotics; Adhesive [tape]; and Penicillins  Review of Systems  Constitutional: Negative for chills, fever and malaise/fatigue.  HENT: Negative for congestion, sinus pain and sore throat.   Eyes: Negative for blurred vision and pain.  Respiratory: Negative for cough and wheezing.   Cardiovascular: Negative for chest pain and leg swelling.  Gastrointestinal: Negative for abdominal pain, constipation, diarrhea, heartburn, nausea and vomiting.  Genitourinary: Negative for dysuria, frequency, hematuria and urgency.  Musculoskeletal: Negative for back pain, joint pain, myalgias and neck pain.  Skin: Negative for itching and rash.  Neurological: Negative for dizziness, tremors and weakness.  Endo/Heme/Allergies: Does not bruise/bleed easily.  Psychiatric/Behavioral: Negative for depression. The patient is not nervous/anxious and does not have insomnia.     Objective: BP 120/80   Ht 5' 4" (1.626 m)   Wt 162 lb (73.5 kg)   LMP  (LMP Unknown)   BMI 27.81 kg/m   Filed Weights   02/19/18 1330  Weight: 162 lb (73.5 kg)   Physical Exam  Constitutional: She is oriented to person, place, and time. She appears well-developed and well-nourished. No distress.  HENT:  Head: Normocephalic and atraumatic. Head is without   laceration.  Right Ear: Hearing normal.  Left Ear: Hearing normal.  Nose: No epistaxis.  No foreign bodies.  Mouth/Throat: Uvula is midline, oropharynx is clear and moist and mucous membranes are normal.  Eyes: Pupils are equal, round, and reactive to light.  Neck: Normal range of motion. Neck supple. No thyromegaly present.  Cardiovascular:  Normal rate and regular rhythm. Exam reveals no gallop and no friction rub.  No murmur heard. Pulmonary/Chest: Effort normal and breath sounds normal. No respiratory distress. She has no wheezes. Right breast exhibits no mass, no skin change and no tenderness. Left breast exhibits no mass, no skin change and no tenderness.  Abdominal: Soft. Bowel sounds are normal. She exhibits no distension. There is no tenderness. There is no rebound.  Musculoskeletal: Normal range of motion.  Neurological: She is alert and oriented to person, place, and time. No cranial nerve deficit.  Skin: Skin is warm and dry.  Psychiatric: She has a normal mood and affect. Judgment normal.  Vitals reviewed.   Assessment: 1. Left ovarian cyst   2. LLQ pain   Plan Lap LO which pt prefers over cystectomy.  Has had LLQ pain in past even prior to hyst (2016).  I have had a careful discussion with this patient about all the options available and the risk/benefits of each. I have fully informed this patient that surgery may subject her to a variety of discomforts and risks: She understands that most patients have surgery with little difficulty, but problems can happen ranging from minor to fatal. These include nausea, vomiting, pain, bleeding, infection, poor healing, hernia, or formation of adhesions. Unexpected reactions may occur from any drug or anesthetic given. Unintended injury may occur to other pelvic or abdominal structures such as Fallopian tubes, ovaries, bladder, ureter (tube from kidney to bladder), or bowel. Nerves going from the pelvis to the legs may be injured. Any such injury may require immediate or later additional surgery to correct the problem. Excessive blood loss requiring transfusion is very unlikely but possible. Dangerous blood clots may form in the legs or lungs. Physical and sexual activity will be restricted in varying degrees for an indeterminate period of time but most often 2-6 weeks.  Finally, she  understands that it is impossible to list every possible undesirable effect and that the condition for which surgery is done is not always cured or significantly improved, and in rare cases may be even worse.Ample time was given to answer all questions.  Paul Tonianne Fine, MD, FACOG [facility], [facility] 02/19/2018  1:40 PM  

## 2018-02-19 NOTE — Patient Instructions (Signed)
Unilateral Salpingo-Oophorectomy, Care After °Refer to this sheet in the next few weeks. These instructions provide you with information on caring for yourself after your procedure. Your health care provider may also give you more specific instructions. Your treatment has been planned according to current medical practices, but problems sometimes occur. Call your health care provider if you have any problems or questions after your procedure. °What can I expect after the procedure? °After the procedure, it is typical to have the following: °· Abdominal pain that can be controlled with pain medicine. °· Vaginal spotting. °· Constipation. ° °Follow these instructions at home: °· Get plenty of rest and sleep. °· Only take over-the-counter or prescription medicines as directed by your health care provider. Do not take aspirin. It can cause bleeding. °· Keep incision areas clean and dry. Remove or change any bandages (dressings) only as directed by your health care provider. °· Follow your health care provider's advice regarding diet. °· Drink enough fluids to keep your urine clear or pale yellow. °· Limit exercise and activities as directed by your health care provider. Do not lift anything heavier than 5 pounds (2.3 kg) until your health care provider approves. °· Do not drive until your health care provider approves. °· Do not drink alcohol until your health care provider approves. °· Do not have sexual intercourse until your health care provider says it is OK. °· Take your temperature twice a day and write it down. °· If you become constipated, you may: °? Ask your health care provider about taking a mild laxative. °? Add more fruit and bran to your diet. °? Drink more fluids. °· Follow up with your health care provider as directed. °Contact a health care provider if: °· You have swelling or redness in the incision area. °· You develop a rash. °· You feel lightheaded. °· You have pain that is not controlled with  medicine. °· You have pain, swelling, or redness where the IV access tube was placed. °Get help right away if: °· You have a fever. °· You develop increasing abdominal pain. °· You see pus coming out of the incision, or the incision is separating. °· You notice a bad smell coming from the wound or dressing. °· You have excessive vaginal bleeding. °· You feel sick to your stomach (nauseous) and vomit. °· You have leg or chest pain. °· You have pain when you urinate. °· You develop shortness of breath. °· You pass out. °This information is not intended to replace advice given to you by your health care provider. Make sure you discuss any questions you have with your health care provider. °Document Released: 05/07/2009 Document Revised: 06/10/2016 Document Reviewed: 01/02/2013 °Elsevier Interactive Patient Education © 2018 [employer] ° °

## 2018-02-19 NOTE — H&P (View-Only) (Signed)
PRE-OPERATIVE HISTORY AND PHYSICAL EXAM  HPI:  Elizabeth Shelton is a 42 y.o. [id number] No LMP recorded (lmp unknown). Patient has had a hysterectomy.; she is being admitted for surgery related to pelvic pain.  [name] has had LLQ pain for 2 weeks with ultrasound reveling a 5.4 cm complex left ovarian cyst.  Pt has had prior hysterectomy.  Pt has LLQ pain that radiates to back and is intermittant but severe when it occurs, rest makes better and [place] and sex making worse.  Has had pain off an on on left for many years.  PMHx: Past Medical History:  Diagnosis Date  . Medical history non-contributory    Past Surgical History:  Procedure Laterality Date  . CYSTOSCOPY N/A 01/29/2015   Procedure: CYSTOSCOPY;  Surgeon: Nadara Mustard, MD;  Location: [facility];  Service: Gynecology;  Laterality: N/A;  . LAPAROSCOPIC BILATERAL SALPINGECTOMY Bilateral 01/29/2015   Procedure: LAPAROSCOPIC BILATERAL SALPINGECTOMY;  Surgeon: Nadara Mustard, MD;  Location: [facility];  Service: Gynecology;  Laterality: Bilateral;  . LAPAROSCOPIC HYSTERECTOMY N/A 01/29/2015   Procedure: HYSTERECTOMY TOTAL LAPAROSCOPIC;  Surgeon: Nadara Mustard, MD;  Location: [facility];  Service: Gynecology;  Laterality: N/A;  . NO PAST SURGERIES     Family History  Problem Relation Age of Onset  . Hypercholesterolemia Mother 39  . Irritable bowel syndrome Sister    Social History   Tobacco Use  . Smoking status: Never Smoker  . Smokeless tobacco: Never Used  Substance Use Topics  . Alcohol use: No  . Drug use: No    Current Outpatient Medications:  .  ibuprofen (ADVIL,MOTRIN) 200 MG tablet, Take 600 mg by mouth every 6 (six) hours as needed for headache. 600-800 mg as needed for head ache., Disp: , Rfl:  .  oxyCODONE-acetaminophen (PERCOCET/ROXICET) 5-325 MG per tablet, Take 1-2 tablets by mouth every 4 (four) hours as needed (moderate to severe pain (when tolerating fluids)). (Patient not taking: Reported on 02/12/2018), Disp: 50 tablet,  Rfl: 0 .  Tavaborole (KERYDIN) 5 % SOLN, Apply 1 drop topically daily. (Patient not taking: Reported on 02/12/2018), Disp: 1 Bottle, Rfl: 11 .  terbinafine (LAMISIL) 250 MG tablet, Take 1 tablet (250 mg total) by mouth daily. (Patient not taking: Reported on 02/12/2018), Disp: 30 tablet, Rfl: 2 Allergies: Amoxicillin; Aspirin; Codeine; Sulfa antibiotics; Adhesive [tape]; and Penicillins  Review of Systems  Constitutional: Negative for chills, fever and malaise/fatigue.  HENT: Negative for congestion, sinus pain and sore throat.   Eyes: Negative for blurred vision and pain.  Respiratory: Negative for cough and wheezing.   Cardiovascular: Negative for chest pain and leg swelling.  Gastrointestinal: Negative for abdominal pain, constipation, diarrhea, heartburn, nausea and vomiting.  Genitourinary: Negative for dysuria, frequency, hematuria and urgency.  Musculoskeletal: Negative for back pain, joint pain, myalgias and neck pain.  Skin: Negative for itching and rash.  Neurological: Negative for dizziness, tremors and weakness.  Endo/Heme/Allergies: Does not bruise/bleed easily.  Psychiatric/Behavioral: Negative for depression. The patient is not nervous/anxious and does not have insomnia.     Objective: BP 120/80   Ht 5\' 4"  (1.626 m)   Wt 162 lb (73.5 kg)   LMP  (LMP Unknown)   BMI 27.81 kg/m   Filed Weights   02/19/18 1330  Weight: 162 lb (73.5 kg)   Physical Exam  Constitutional: She is oriented to person, place, and time. She appears well-developed and well-nourished. No distress.  HENT:  Head: Normocephalic and atraumatic. Head is without  laceration.  Right Ear: Hearing normal.  Left Ear: Hearing normal.  Nose: No epistaxis.  No foreign bodies.  Mouth/Throat: Uvula is midline, oropharynx is clear and moist and mucous membranes are normal.  Eyes: Pupils are equal, round, and reactive to light.  Neck: Normal range of motion. Neck supple. No thyromegaly present.  Cardiovascular:  Normal rate and regular rhythm. Exam reveals no gallop and no friction rub.  No murmur heard. Pulmonary/Chest: Effort normal and breath sounds normal. No respiratory distress. She has no wheezes. Right breast exhibits no mass, no skin change and no tenderness. Left breast exhibits no mass, no skin change and no tenderness.  Abdominal: Soft. Bowel sounds are normal. She exhibits no distension. There is no tenderness. There is no rebound.  Musculoskeletal: Normal range of motion.  Neurological: She is alert and oriented to person, place, and time. No cranial nerve deficit.  Skin: Skin is warm and dry.  Psychiatric: She has a normal mood and affect. Judgment normal.  Vitals reviewed.   Assessment: 1. Left ovarian cyst   2. LLQ pain   Plan Lap LO which pt prefers over cystectomy.  Has had LLQ pain in past even prior to hyst (2016).  I have had a careful discussion with this patient about all the options available and the risk/benefits of each. I have fully informed this patient that surgery may subject her to a variety of discomforts and risks: She understands that most patients have surgery with little difficulty, but problems can happen ranging from minor to fatal. These include nausea, vomiting, pain, bleeding, infection, poor healing, hernia, or formation of adhesions. Unexpected reactions may occur from any drug or anesthetic given. Unintended injury may occur to other pelvic or abdominal structures such as Fallopian tubes, ovaries, bladder, ureter (tube from kidney to bladder), or bowel. Nerves going from the pelvis to the legs may be injured. Any such injury may require immediate or later additional surgery to correct the problem. Excessive blood loss requiring transfusion is very unlikely but possible. Dangerous blood clots may form in the legs or lungs. Physical and sexual activity will be restricted in varying degrees for an indeterminate period of time but most often 2-6 weeks.  Finally, she  understands that it is impossible to list every possible undesirable effect and that the condition for which surgery is done is not always cured or significantly improved, and in rare cases may be even worse.Ample time was given to answer all questions.  Annamarie MajorPaul Jashay Roddy, MD, [facility]inda FrederickFACOG [facility], [facility] 02/19/2018  1:40 PM

## 2018-02-19 NOTE — Addendum Note (Signed)
Addended by: Nadara MustardHARRIS, Hanifah Royse P on: 02/19/2018 01:45 PM   Modules accepted: Orders, SmartSet

## 2018-02-20 ENCOUNTER — Other Ambulatory Visit: Payer: Self-pay

## 2018-02-20 ENCOUNTER — Encounter
Admission: RE | Admit: 2018-02-20 | Discharge: 2018-02-20 | Disposition: A | Discharge status: Home / Self Care | Payer: BLUE CROSS/BLUE SHIELD | Source: Ambulatory Visit | Attending: Obstetrics & Gynecology | Admitting: Obstetrics & Gynecology

## 2018-02-20 HISTORY — DX: Other complications of anesthesia, initial encounter: T88.59XA

## 2018-02-20 HISTORY — DX: Adverse effect of unspecified anesthetic, initial encounter: T41.45XA

## 2018-02-20 HISTORY — DX: Palpitations: R00.2

## 2018-02-20 HISTORY — DX: Family history of other specified conditions: Z84.89

## 2018-02-20 HISTORY — DX: Tachycardia, unspecified: R00.0

## 2018-02-20 HISTORY — DX: Nausea with vomiting, unspecified: R11.2

## 2018-02-20 HISTORY — DX: Anemia, unspecified: D64.9

## 2018-02-20 HISTORY — DX: Other specified postprocedural states: Z98.890

## 2018-02-20 HISTORY — DX: Gastro-esophageal reflux disease without esophagitis: K21.9

## 2018-02-20 NOTE — Patient Instructions (Signed)
Your procedure is scheduled on: 02-22-18 THURSDAY Report to Same Day Surgery 2nd floor medical mall [facility] Entrance-take elevator on left to 2nd floor.  Check in with surgery information desk.) To find out your arrival time please call [phone] between 1PM - 3PM on 02-21-18 [facility]  Remember: Instructions that are not followed completely may result in serious medical risk, up to and including death, or upon the discretion of your surgeon and anesthesiologist your surgery may need to be rescheduled.    _x___ 1. Do not eat food after midnight the night before your procedure. NO GUM OR CANDY AFTER MIDNIGHT.  You may drink clear liquids up to 2 hours before you are scheduled to arrive at the hospital for your procedure.  Do not drink clear liquids within 2 hours of your scheduled arrival to the hospital.  Clear liquids include  --Water or Apple juice without pulp  --Clear carbohydrate beverage such as ClearFast or Gatorade  --Black Coffee or Clear Tea (No milk, no creamers, do not add anything to the coffee or Tea     __x__ 2. No Alcohol for 24 hours before or after surgery.   __x__3. No Smoking or e-cigarettes for 24 prior to surgery.  Do not use any chewable tobacco products for at least 6 hour prior to surgery   ____  4. Bring all medications with you on the day of surgery if instructed.    __x__ 5. Notify your doctor if there is any change in your medical condition     (cold, fever, infections).    x___6. On the morning of surgery brush your teeth with toothpaste and water.  You may rinse your mouth with mouth wash if you wish.  Do not swallow any toothpaste or mouthwash.   Do not wear jewelry, make-up, hairpins, clips or nail polish.  Do not wear lotions, powders, or perfumes. You may wear deodorant.  Do not shave 48 hours prior to surgery. Men may shave face and neck.  Do not bring valuables to the hospital.    [facility] is not responsible for any belongings or  valuables.               Contacts, dentures or bridgework may not be worn into surgery.  Leave your suitcase in the car. After surgery it may be brought to your room.  For patients admitted to the hospital, discharge time is determined by your treatment team.  _  Patients discharged the day of surgery will not be allowed to drive home.  You will need someone to drive you home and stay with you the night of your procedure.    Please read over the following fact sheets that you were given:   [facility] Preparing for Surgery  _x___ TAKE THE FOLLOWING MEDICATION THE MORNING OF SURGERY WITH A SMALL SIP OF WATER. These include:  1. PEPCID  2. TAKE A PEPCID THE NIGHT BEFORE YOUR SURGERY  3.  4.  5.  6.  ____Fleets enema or Magnesium Citrate as directed.   _x___ Use CHG Soap or sage wipes as directed on instruction sheet   ____ Use inhalers on the day of surgery and bring to hospital day of surgery  ____ Stop Metformin and Janumet 2 days prior to surgery.    ____ Take 1/2 of usual insulin dose the night before surgery and none on the morning surgery.   ____ Follow recommendations from Cardiologist, Pulmonologist or PCP regarding stopping Aspirin, Coumadin, Plavix ,Eliquis, Effient,  or Pradaxa, and Pletal.  X____Stop Anti-inflammatories such as Advil, Aleve, Ibuprofen, Motrin, Naproxen, Naprosyn, Goodies powders or aspirin products NOW- OK to take Tylenol   ____ Stop supplements until after surgery.     ____ Bring C-Pap to the hospital.

## 2018-02-21 ENCOUNTER — Encounter
Admission: RE | Admit: 2018-02-21 | Discharge: 2018-02-21 | Disposition: A | Discharge status: Home / Self Care | Payer: BLUE CROSS/BLUE SHIELD | Source: Ambulatory Visit | Attending: Obstetrics & Gynecology | Admitting: Obstetrics & Gynecology

## 2018-02-21 DIAGNOSIS — Z01812 Encounter for preprocedural laboratory examination: Secondary | ICD-10-CM | POA: Diagnosis not present

## 2018-02-21 DIAGNOSIS — Z01810 Encounter for preprocedural cardiovascular examination: Secondary | ICD-10-CM | POA: Insufficient documentation

## 2018-02-21 DIAGNOSIS — R000 Tachycardia, unspecified: Secondary | ICD-10-CM | POA: Insufficient documentation

## 2018-02-21 LAB — CBC
HCT: 38.7 % (ref 35.0–47.0)
Hemoglobin: 13.2 g/dL (ref 12.0–16.0)
MCH: 30.3 pg (ref 26.0–34.0)
MCHC: 34.2 g/dL (ref 32.0–36.0)
MCV: 88.8 fL (ref 80.0–100.0)
PLATELETS: 297 10*3/uL (ref 150–440)
RBC: 4.36 MIL/uL (ref 3.80–5.20)
RDW: 12.7 % (ref 11.5–14.5)
WBC: 5.3 10*3/uL (ref 3.6–11.0)

## 2018-02-21 LAB — TYPE AND SCREEN
ABO/RH(D): A POS
Antibody Screen: NEGATIVE

## 2018-02-22 ENCOUNTER — Ambulatory Visit
Admission: RE | Admit: 2018-02-22 | Discharge: 2018-02-22 | Disposition: A | Discharge status: Home / Self Care | Payer: BLUE CROSS/BLUE SHIELD | Source: Ambulatory Visit | Attending: Obstetrics & Gynecology | Admitting: Obstetrics & Gynecology

## 2018-02-22 ENCOUNTER — Ambulatory Visit: Payer: BLUE CROSS/BLUE SHIELD | Admitting: Anesthesiology

## 2018-02-22 ENCOUNTER — Encounter
Admission: RE | Disposition: A | Discharge status: Home / Self Care | Payer: Self-pay | Source: Ambulatory Visit | Attending: Obstetrics & Gynecology

## 2018-02-22 DIAGNOSIS — N801 Endometriosis of ovary: Secondary | ICD-10-CM | POA: Insufficient documentation

## 2018-02-22 DIAGNOSIS — Z880 Allergy status to penicillin: Secondary | ICD-10-CM | POA: Diagnosis not present

## 2018-02-22 DIAGNOSIS — N83202 Unspecified ovarian cyst, left side: Secondary | ICD-10-CM | POA: Diagnosis present

## 2018-02-22 DIAGNOSIS — Z885 Allergy status to narcotic agent status: Secondary | ICD-10-CM | POA: Diagnosis not present

## 2018-02-22 DIAGNOSIS — N8312 Corpus luteum cyst of left ovary: Secondary | ICD-10-CM | POA: Insufficient documentation

## 2018-02-22 DIAGNOSIS — Z886 Allergy status to analgesic agent status: Secondary | ICD-10-CM | POA: Diagnosis not present

## 2018-02-22 DIAGNOSIS — Z90710 Acquired absence of both cervix and uterus: Secondary | ICD-10-CM | POA: Diagnosis not present

## 2018-02-22 DIAGNOSIS — K660 Peritoneal adhesions (postprocedural) (postinfection): Secondary | ICD-10-CM | POA: Insufficient documentation

## 2018-02-22 DIAGNOSIS — K219 Gastro-esophageal reflux disease without esophagitis: Secondary | ICD-10-CM | POA: Insufficient documentation

## 2018-02-22 DIAGNOSIS — Z882 Allergy status to sulfonamides status: Secondary | ICD-10-CM | POA: Diagnosis not present

## 2018-02-22 DIAGNOSIS — Z888 Allergy status to other drugs, medicaments and biological substances status: Secondary | ICD-10-CM | POA: Diagnosis not present

## 2018-02-22 DIAGNOSIS — Z79899 Other long term (current) drug therapy: Secondary | ICD-10-CM | POA: Diagnosis not present

## 2018-02-22 DIAGNOSIS — R1032 Left lower quadrant pain: Secondary | ICD-10-CM | POA: Diagnosis present

## 2018-02-22 HISTORY — PX: LAPAROSCOPIC LYSIS OF ADHESIONS: SHX5905

## 2018-02-22 LAB — ABO/RH: ABO/RH(D): A POS

## 2018-02-22 SURGERY — OOPHORECTOMY, LAPAROSCOPIC
Anesthesia: General | Laterality: Left

## 2018-02-22 MED ORDER — FENTANYL CITRATE (PF) 100 MCG/2ML IJ SOLN
INTRAMUSCULAR | Status: AC
  Filled 2018-02-22: qty 2

## 2018-02-22 MED ORDER — LACTATED RINGERS IV SOLN: INTRAVENOUS | Status: DC

## 2018-02-22 MED ORDER — DEXAMETHASONE SODIUM PHOSPHATE 10 MG/ML IJ SOLN
INTRAMUSCULAR | Status: AC
  Filled 2018-02-22: qty 1

## 2018-02-22 MED ORDER — OXYCODONE-ACETAMINOPHEN 5-325 MG PO TABS
1.0000 | ORAL_TABLET | ORAL | Status: DC | PRN
  Administered 2018-02-22: 1 via ORAL

## 2018-02-22 MED ORDER — SUGAMMADEX SODIUM 200 MG/2ML IV SOLN
INTRAVENOUS | Status: DC | PRN
  Administered 2018-02-22: 150 mg via INTRAVENOUS

## 2018-02-22 MED ORDER — MIDAZOLAM HCL 2 MG/2ML IJ SOLN
INTRAMUSCULAR | Status: DC | PRN
  Administered 2018-02-22: 2 mg via INTRAVENOUS

## 2018-02-22 MED ORDER — SUGAMMADEX SODIUM 200 MG/2ML IV SOLN
INTRAVENOUS | Status: AC
  Filled 2018-02-22: qty 2

## 2018-02-22 MED ORDER — ONDANSETRON HCL 4 MG/2ML IJ SOLN
INTRAMUSCULAR | Status: AC
  Filled 2018-02-22: qty 2

## 2018-02-22 MED ORDER — ACETAMINOPHEN NICU IV SYRINGE 10 MG/ML
INTRAVENOUS | Status: AC
  Filled 2018-02-22: qty 1

## 2018-02-22 MED ORDER — ACETAMINOPHEN 325 MG PO TABS: 650.0000 mg | ORAL_TABLET | ORAL | Status: DC | PRN

## 2018-02-22 MED ORDER — ROCURONIUM BROMIDE 100 MG/10ML IV SOLN
INTRAVENOUS | Status: DC | PRN
  Administered 2018-02-22: 50 mg via INTRAVENOUS

## 2018-02-22 MED ORDER — TRAMADOL HCL 50 MG PO TABS
ORAL_TABLET | ORAL | Status: AC
  Filled 2018-02-22: qty 1

## 2018-02-22 MED ORDER — MIDAZOLAM HCL 2 MG/2ML IJ SOLN
INTRAMUSCULAR | Status: AC
  Filled 2018-02-22: qty 2

## 2018-02-22 MED ORDER — MORPHINE SULFATE (PF) 4 MG/ML IV SOLN: 1.0000 mg | INTRAVENOUS | Status: DC | PRN

## 2018-02-22 MED ORDER — ROCURONIUM BROMIDE 100 MG/10ML IV SOLN
INTRAVENOUS | Status: AC
  Filled 2018-02-22: qty 1

## 2018-02-22 MED ORDER — PROMETHAZINE HCL 25 MG PO TABS: 25.0000 mg | ORAL_TABLET | Freq: Four times a day (QID) | ORAL | 0 refills | Status: DC | PRN

## 2018-02-22 MED ORDER — FENTANYL CITRATE (PF) 100 MCG/2ML IJ SOLN
INTRAMUSCULAR | Status: DC | PRN
  Administered 2018-02-22: 100 ug via INTRAVENOUS
  Administered 2018-02-22: 50 ug via INTRAVENOUS
  Administered 2018-02-22 (×2): 25 ug via INTRAVENOUS

## 2018-02-22 MED ORDER — PROPOFOL 500 MG/50ML IV EMUL
INTRAVENOUS | Status: DC | PRN
  Administered 2018-02-22: 150 ug/kg/min via INTRAVENOUS

## 2018-02-22 MED ORDER — LACTATED RINGERS IV SOLN
INTRAVENOUS | Status: DC
  Administered 2018-02-22: 10:00:00 via INTRAVENOUS

## 2018-02-22 MED ORDER — DEXAMETHASONE SODIUM PHOSPHATE 10 MG/ML IJ SOLN
INTRAMUSCULAR | Status: DC | PRN
  Administered 2018-02-22: 10 mg via INTRAVENOUS

## 2018-02-22 MED ORDER — TRAMADOL HCL 50 MG PO TABS: 50.0000 mg | ORAL_TABLET | Freq: Four times a day (QID) | ORAL | Status: DC | PRN

## 2018-02-22 MED ORDER — OXYCODONE-ACETAMINOPHEN 5-325 MG PO TABS: 1.0000 | ORAL_TABLET | ORAL | 0 refills | Status: DC | PRN

## 2018-02-22 MED ORDER — OXYCODONE-ACETAMINOPHEN 5-325 MG PO TABS
ORAL_TABLET | ORAL | Status: AC
  Filled 2018-02-22: qty 1

## 2018-02-22 MED ORDER — ACETAMINOPHEN 650 MG RE SUPP
650.0000 mg | RECTAL | Status: DC | PRN
  Filled 2018-02-22: qty 1

## 2018-02-22 MED ORDER — PROPOFOL 10 MG/ML IV BOLUS
INTRAVENOUS | Status: DC | PRN
  Administered 2018-02-22: 150 mg via INTRAVENOUS
  Administered 2018-02-22: 50 mg via INTRAVENOUS

## 2018-02-22 MED ORDER — LIDOCAINE HCL (CARDIAC) PF 100 MG/5ML IV SOSY
PREFILLED_SYRINGE | INTRAVENOUS | Status: DC | PRN
Start: 2018-02-22 — End: 2018-02-22
  Administered 2018-02-22: 100 mg via INTRAVENOUS

## 2018-02-22 MED ORDER — LIDOCAINE HCL (PF) 2 % IJ SOLN
INTRAMUSCULAR | Status: AC
  Filled 2018-02-22: qty 10

## 2018-02-22 MED ORDER — KETOROLAC TROMETHAMINE 30 MG/ML IJ SOLN
INTRAMUSCULAR | Status: AC
Start: 2018-02-22 — End: ?
  Filled 2018-02-22: qty 1

## 2018-02-22 MED ORDER — ONDANSETRON HCL 4 MG/2ML IJ SOLN: 4.0000 mg | Freq: Once | INTRAMUSCULAR | Status: DC | PRN

## 2018-02-22 MED ORDER — KETOROLAC TROMETHAMINE 30 MG/ML IJ SOLN
30.0000 mg | Freq: Four times a day (QID) | INTRAMUSCULAR | Status: DC
  Filled 2018-02-22: qty 1

## 2018-02-22 MED ORDER — PROMETHAZINE HCL 25 MG PO TABS: 25.0000 mg | ORAL_TABLET | Freq: Four times a day (QID) | ORAL | Status: DC | PRN

## 2018-02-22 MED ORDER — ONDANSETRON HCL 4 MG/2ML IJ SOLN
INTRAMUSCULAR | Status: DC | PRN
  Administered 2018-02-22: 4 mg via INTRAVENOUS

## 2018-02-22 MED ORDER — ACETAMINOPHEN 10 MG/ML IV SOLN
INTRAVENOUS | Status: DC | PRN
  Administered 2018-02-22: 1000 mg via INTRAVENOUS

## 2018-02-22 MED ORDER — TRAMADOL HCL 50 MG PO TABS
50.0000 mg | ORAL_TABLET | Freq: Once | ORAL | Status: AC
  Administered 2018-02-22: 50 mg via ORAL

## 2018-02-22 MED ORDER — PROPOFOL 500 MG/50ML IV EMUL
INTRAVENOUS | Status: AC
  Filled 2018-02-22: qty 50

## 2018-02-22 MED ORDER — TRAMADOL HCL 50 MG PO TABS: 50.0000 mg | ORAL_TABLET | Freq: Four times a day (QID) | ORAL | 0 refills | Status: DC | PRN

## 2018-02-22 MED ORDER — FENTANYL CITRATE (PF) 100 MCG/2ML IJ SOLN
INTRAMUSCULAR | Status: AC
  Administered 2018-02-22: 25 ug via INTRAVENOUS
  Filled 2018-02-22: qty 2

## 2018-02-22 MED ORDER — PROPOFOL 10 MG/ML IV BOLUS
INTRAVENOUS | Status: AC
  Filled 2018-02-22: qty 20

## 2018-02-22 MED ORDER — FENTANYL CITRATE (PF) 100 MCG/2ML IJ SOLN
25.0000 ug | INTRAMUSCULAR | Status: DC | PRN
  Administered 2018-02-22 (×2): 25 ug via INTRAVENOUS

## 2018-02-22 MED ORDER — BUPIVACAINE HCL (PF) 0.5 % IJ SOLN
INTRAMUSCULAR | Status: DC | PRN
  Administered 2018-02-22: 11 mL

## 2018-02-22 SURGICAL SUPPLY — 40 items
BLADE SURG SZ11 CARB STEEL (BLADE) ×3 IMPLANT
CANISTER SUCT 1200ML W/VALVE (MISCELLANEOUS) ×3 IMPLANT
CATH ROBINSON RED A/P 16FR (CATHETERS) IMPLANT
CHLORAPREP W/TINT 26ML (MISCELLANEOUS) ×3 IMPLANT
DERMABOND ADVANCED (GAUZE/BANDAGES/DRESSINGS) ×1
DERMABOND ADVANCED .7 DNX12 (GAUZE/BANDAGES/DRESSINGS) ×2 IMPLANT
DRSG TELFA 4X3 1S NADH ST (GAUZE/BANDAGES/DRESSINGS) IMPLANT
GLOVE BIO SURGEON STRL SZ8 (GLOVE) ×12 IMPLANT
GLOVE INDICATOR 8.0 STRL GRN (GLOVE) ×12 IMPLANT
GOWN STRL REUS W/ TWL LRG LVL3 (GOWN DISPOSABLE) ×2 IMPLANT
GOWN STRL REUS W/ TWL XL LVL3 (GOWN DISPOSABLE) ×2 IMPLANT
GOWN STRL REUS W/TWL LRG LVL3 (GOWN DISPOSABLE) ×1
GOWN STRL REUS W/TWL XL LVL3 (GOWN DISPOSABLE) ×1
GRASPER SUT TROCAR 14GX15 (MISCELLANEOUS) ×3 IMPLANT
HEMOSTAT ARISTA ABSORB 3G PWDR (MISCELLANEOUS) ×3 IMPLANT
IRRIGATION STRYKERFLOW (MISCELLANEOUS) ×2 IMPLANT
IRRIGATOR STRYKERFLOW (MISCELLANEOUS) ×3
IV LACTATED RINGERS 1000ML (IV SOLUTION) ×3 IMPLANT
KIT PINK PAD W/HEAD ARE REST (MISCELLANEOUS) ×3
KIT PINK PAD W/HEAD ARM REST (MISCELLANEOUS) ×2 IMPLANT
LABEL OR SOLS (LABEL) ×3 IMPLANT
NEEDLE VERESS 14GA 120MM (NEEDLE) ×3 IMPLANT
NS IRRIG 500ML POUR BTL (IV SOLUTION) ×3 IMPLANT
PACK GYN LAPAROSCOPIC (MISCELLANEOUS) ×3 IMPLANT
PAD PREP 24X41 OB/GYN DISP (PERSONAL CARE ITEMS) ×3 IMPLANT
POUCH SPECIMEN RETRIEVAL 10MM (ENDOMECHANICALS) ×3 IMPLANT
SCISSORS METZENBAUM CVD 33 (INSTRUMENTS) IMPLANT
SHEARS HARMONIC ACE PLUS 36CM (ENDOMECHANICALS) ×3 IMPLANT
SLEEVE ENDOPATH XCEL 5M (ENDOMECHANICALS) ×3 IMPLANT
SPONGE GAUZE 2X2 8PLY STRL LF (GAUZE/BANDAGES/DRESSINGS) IMPLANT
STRAP SAFETY 5IN WIDE (MISCELLANEOUS) ×3 IMPLANT
SUT VIC AB 0 CT1 36 (SUTURE) ×3 IMPLANT
SUT VIC AB 2-0 UR6 27 (SUTURE) IMPLANT
SUT VIC AB 4-0 PS2 18 (SUTURE) ×3 IMPLANT
SUT VICRYL 0 AB UR-6 (SUTURE) ×3 IMPLANT
SYR 10ML LL (SYRINGE) ×3 IMPLANT
TRAY FOLEY SLVR 16FR LF STAT (SET/KITS/TRAYS/PACK) ×3 IMPLANT
TROCAR ENDO BLADELESS 11MM (ENDOMECHANICALS) IMPLANT
TROCAR XCEL NON-BLD 5MMX100MML (ENDOMECHANICALS) ×3 IMPLANT
TUBING INSUFFLATION (TUBING) ×3 IMPLANT

## 2018-02-22 NOTE — Progress Notes (Signed)
States pain is improved   No nausea

## 2018-02-22 NOTE — Anesthesia Post-op Follow-up Note (Signed)
Anesthesia QCDR form completed.        

## 2018-02-22 NOTE — Op Note (Signed)
Operative Note:  PRE-OP DIAGNOSIS: LEFT LOWER QUADRANT PAIN,LEFT OVARIAN CYST   POST-OP DIAGNOSIS: LEFT LOWER QUADRANT PAIN,LEFT OVARIAN CYST   PROCEDURE: Procedure(s): LAPAROSCOPIC OOPHORECTOMY LAPAROSCOPIC LYSIS OF ADHESIONS  SURGEON: Annamarie MajorPaul Chikita Dogan, MD, FACOG  ANESTHESIA: General endotracheal anesthesia  ESTIMATED BLOOD LOSS: less than 50   SPECIMENS: Left Ovary  COMPLICATIONS: None  DISPOSITION: stable to PACU  FINDINGS: Intraabdominal adhesions were noted. Especially surrounding the left adnexa to include omental and colon.  Right ovary normal.  PROCEDURE IN DETAIL: The patient was taken to the OR where anesthesia was administed. The patient was positioned supine. Foley placed.  Attention was turned to the patient's abdomen where a 5 mm skin incision was made in the umbilical fold, after injection of local anesthesia. The Veress step needle was carefully introduced into the peritoneal cavity with placement confirmed using the hanging drop technique. Pneumoperitoneum was obtained. The 5 mm port was then placed under direct visualization with the operative laparoscope The above noted findings. Trendelenburg.  5 mm and 11 mm trocars were then placed in the LLQ (11mm) and RLQ lateral to the inferior epigastric blood vessels under direct visualization with the laparoscope. Left ovary is identified and lysis of extensive adhesions is performed to isolate the ovary from surrounding tissues.   The ovarian blood vessels/infundibulopelvic ligament pedicles are coagulated and ligated using the Harmonic scapel. Ovary is removed through a specimen bag. No injuries or bleeding was noted.  Ureter identified and followed through its course beneath where the ovary and adhesions were dissected.   Right ovary observed as normal.  Irrigation performed and hemostasis assured; Arista used along adhesion area to improve hemostasis.  All instruments and ports were then removed from the abdomen after  gas was expelled and patient was leveled. The skin was closed with skin adhesive. The foley catheter was removed. The patient tolerated the procedure well. All counts were correct x 2. The patient was transferred to the recovery room awake, alert and breathing independently.  Annamarie MajorPaul Mikaele Stecher, MD, Merlinda FrederickFACOG [facility], [facility] 02/22/2018  11:26 AM

## 2018-02-22 NOTE — Discharge Instructions (Signed)
General Gynecological Post-Operative Instructions °You may expect to feel dizzy, weak, and drowsy for as long as 24 hours after receiving the medicine that made you sleep (anesthetic).  °Do not drive a car, ride a bicycle, participate in physical activities, or take public transportation until you are done taking narcotic pain medicines or as directed by your doctor.  °Do not drink alcohol or take tranquilizers.  °Do not take medicine that has not been prescribed by your doctor.  °Do not sign important papers or make important decisions while on narcotic pain medicines.  °Have a responsible person with you.  °CARE OF INCISION  °Keep incision clean and dry. °Take showers instead of baths until your doctor gives you permission to take baths.  °Avoid heavy lifting (more than 10 pounds/4.5 kilograms), pushing, or pulling.  °Avoid activities that may risk injury to your surgical site.  °No sexual intercourse or placement of anything in the vagina for 1 weeks or as instructed by your doctor. °If you have tubes coming from the wound site, check with your doctor regarding appropriate care of the tubes. °Only take prescription or over-the-counter medicines  for pain, discomfort, or fever as directed by your doctor. Do not take aspirin. It can make you bleed. Take medicines (antibiotics) that kill germs if they are prescribed for you.  °Call the office or go to the ER if:  °You feel sick to your stomach (nauseous) and you start to throw up (vomit).  °You have trouble eating or drinking.  °You have an oral temperature above 101.  °You have constipation that is not helped by adjusting diet or increasing fluid intake. Pain medicines are a common cause of constipation.  °You have any other concerns. °SEEK IMMEDIATE MEDICAL CARE IF:  °You have persistent dizziness.  °You have difficulty breathing or a congested sounding (croupy) cough.  °You have an oral temperature above 102.5, not controlled by medicine.  °There is increasing  pain or tenderness near or in the surgical site.  ° °AMBULATORY SURGERY  °DISCHARGE INSTRUCTIONS ° ° °1) The drugs that you were given will stay in your system until tomorrow so for the next 24 hours you should not: ° °A) Drive an automobile °B) Make any legal decisions °C) Drink any alcoholic beverage ° ° °2) You may resume regular meals tomorrow.  Today it is better to start with liquids and gradually work up to solid foods. ° °You may eat anything you prefer, but it is better to start with liquids, then soup and crackers, and gradually work up to solid foods. ° ° °3) Please notify your doctor immediately if you have any unusual bleeding, trouble breathing, redness and pain at the surgery site, drainage, fever, or pain not relieved by medication. ° ° ° °4) Additional Instructions: ° ° ° ° ° ° ° °Please contact your physician with any problems or Same Day Surgery at [phone], Monday through Friday 6 am to 4 pm, or [place] at [place] Main number at [phone]. ° °

## 2018-02-22 NOTE — Anesthesia Preprocedure Evaluation (Signed)
Anesthesia Evaluation  Patient identified by MRN, date of birth, ID band Patient awake    Reviewed: Allergy & Precautions, H&P , NPO status , Patient's Chart, lab work & pertinent test results, reviewed documented beta blocker date and time   History of Anesthesia Complications (+) PONV, Family history of anesthesia reaction and history of anesthetic complications  Airway Mallampati: II  TM Distance: >3 FB Neck ROM: full    Dental  (+) Teeth Intact   Pulmonary neg pulmonary ROS,    Pulmonary exam normal        Cardiovascular Exercise Tolerance: Good negative cardio ROS Normal cardiovascular exam Rhythm:regular Rate:Normal     Neuro/Psych Anxiety negative neurological ROS  negative psych ROS   GI/Hepatic negative GI ROS, Neg liver ROS, GERD  Medicated,  Endo/Other  negative endocrine ROS  Renal/GU negative Renal ROS  negative genitourinary   Musculoskeletal   Abdominal   Peds  Hematology negative hematology ROS (+) anemia ,   Anesthesia Other Findings Past Medical History: No date: Anemia No date: Complication of anesthesia No date: Family history of adverse reaction to anesthesia     Comment:  DAD AND PATERNAL GRANDFATHER N/V No date: GERD (gastroesophageal reflux disease) No date: Heart palpitations No date: PONV (postoperative nausea and vomiting) No date: Tachycardia     Comment:  PT HAS HAD EKG'S AND WORN HOLTER 48 HOUR WHICH WERE ALL               NORMAL Past Surgical History: 01/29/2015: CYSTOSCOPY; N/A     Comment:  Procedure: CYSTOSCOPY;  Surgeon: Nadara Mustardobert P Harris, MD;                Location: [facility] ORS;  Service: Gynecology;  Laterality:               N/A; 01/29/2015: LAPAROSCOPIC BILATERAL SALPINGECTOMY; Bilateral     Comment:  Procedure: LAPAROSCOPIC BILATERAL SALPINGECTOMY;                Surgeon: Nadara Mustardobert P Harris, MD;  Location: [facility] ORS;                Service: Gynecology;  Laterality:  Bilateral; 01/29/2015: LAPAROSCOPIC HYSTERECTOMY; N/A     Comment:  Procedure: HYSTERECTOMY TOTAL LAPAROSCOPIC;  Surgeon:               Nadara Mustardobert P Harris, MD;  Location: [facility] ORS;  Service:               Gynecology;  Laterality: N/A; BMI    Body Mass Index:  27.81 kg/m     Reproductive/Obstetrics negative OB ROS                             Anesthesia Physical Anesthesia Plan  ASA: II  Anesthesia Plan: General ETT   Post-op Pain Management:    Induction:   PONV Risk Score and Plan:   Airway Management Planned:   Additional Equipment:   Intra-op Plan:   Post-operative Plan:   Informed Consent: I have reviewed the patients History and Physical, chart, labs and discussed the procedure including the risks, benefits and alternatives for the proposed anesthesia with the patient or authorized representative who has indicated his/her understanding and acceptance.   Dental Advisory Given  Plan Discussed with: CRNA  Anesthesia Plan Comments:         Anesthesia Quick Evaluation

## 2018-02-22 NOTE — Interval H&P Note (Signed)
History and Physical Interval Note:  02/22/2018 9:40 AM  Elizabeth Shelton  has presented today for surgery, with the diagnosis of LEFT LOWER QUADRANT PAIN,LEFT OVARIAN CYST  The various methods of treatment have been discussed with the patient and family. After consideration of risks, benefits and other options for treatment, the patient has consented to  Procedure(s): LAPAROSCOPIC OOPHORECTOMY (Left) as a surgical intervention .  The patient's history has been reviewed, patient examined, no change in status, stable for surgery.  I have reviewed the patient's chart and labs.  Questions were answered to the patient's satisfaction.     Letitia Libraobert Paul Teodor Prater

## 2018-02-22 NOTE — Anesthesia Procedure Notes (Signed)
Procedure Name: Intubation Date/Time: 02/22/2018 10:26 AM Performed by: Doreen Salvage, CRNA Pre-anesthesia Checklist: Patient identified, Patient being monitored, Timeout performed, Emergency Drugs available and Suction available Patient Re-evaluated:Patient Re-evaluated prior to induction Oxygen Delivery Method: Circle system utilized Preoxygenation: Pre-oxygenation with 100% oxygen Induction Type: IV induction Ventilation: Mask ventilation without difficulty Laryngoscope Size: Mac and 3 Grade View: Grade I Tube type: Oral Tube size: 7.0 mm Number of attempts: 1 Airway Equipment and Method: Stylet Placement Confirmation: ETT inserted through vocal cords under direct vision,  positive ETCO2 and breath sounds checked- equal and bilateral Secured at: 21 cm Tube secured with: Tape Dental Injury: Teeth and Oropharynx as per pre-operative assessment

## 2018-02-22 NOTE — Transfer of Care (Signed)
Immediate Anesthesia Transfer of Care Note  Patient: Elizabeth ChampagneErin L Shelton  Procedure(s) Performed: LAPAROSCOPIC OOPHORECTOMY (Left ) LAPAROSCOPIC LYSIS OF ADHESIONS  Patient Location: PACU  Anesthesia Type:General  Level of Consciousness: sedated  Airway & Oxygen Therapy: Patient Spontanous Breathing and Patient connected to face mask oxygen  Post-op Assessment: Report given to RN and Post -op Vital signs reviewed and stable  Post vital signs: Reviewed and stable  Last Vitals:  Vitals Value Taken Time  BP 141/97 02/22/2018 11:34 AM  Temp 36.5 C 02/22/2018 11:33 AM  Pulse 81 02/22/2018 11:34 AM  Resp 15 02/22/2018 11:34 AM  SpO2 100 % 02/22/2018 11:34 AM  Vitals shown include unvalidated device data.  Last Pain:  Vitals:   02/22/18 1133  TempSrc: Temporal         Complications: No apparent anesthesia complications

## 2018-02-23 NOTE — Anesthesia Postprocedure Evaluation (Signed)
Anesthesia Post Note  Patient: [name]  Procedure(s) Performed: LAPAROSCOPIC OOPHORECTOMY (Left ) LAPAROSCOPIC LYSIS OF ADHESIONS  Patient location during evaluation: PACU Anesthesia Type: General Level of consciousness: awake and alert Pain management: pain level controlled Vital Signs Assessment: post-procedure vital signs reviewed and stable Respiratory status: spontaneous breathing, nonlabored ventilation and respiratory function stable Cardiovascular status: blood pressure returned to baseline and stable Postop Assessment: no apparent nausea or vomiting Anesthetic complications: no     Last Vitals:  Vitals:   02/22/18 1228 02/22/18 1308  BP: 135/79 130/70  Pulse: 70 72  Resp: 12   Temp: 36.6 C   SpO2: 100% 100%    Last Pain:  Vitals:   02/22/18 1308  TempSrc:   PainSc: 3                  Julien Oscar Garry Heater Lindel Marcell

## 2018-02-25 LAB — SURGICAL PATHOLOGY

## 2018-03-09 ENCOUNTER — Encounter: Payer: Self-pay | Admitting: Obstetrics & Gynecology

## 2018-03-09 ENCOUNTER — Ambulatory Visit (INDEPENDENT_AMBULATORY_CARE_PROVIDER_SITE_OTHER): Payer: BLUE CROSS/BLUE SHIELD | Admitting: Obstetrics & Gynecology

## 2018-03-09 VITALS — BP 120/80 | Ht 65.0 in | Wt 161.0 lb

## 2018-03-09 DIAGNOSIS — N809 Endometriosis, unspecified: Secondary | ICD-10-CM

## 2018-03-09 NOTE — Progress Notes (Signed)
  Postoperative Follow-up Patient presents post op from left oophorectomy for adnexal mass and endometriosis (has had prior hysterectomy), 2 weeks ago. Images:      Pathology: OVARY, LEFT; OOPHORECTOMY:  - HEMORRHAGIC CORPUS LUTEAL CYST.  - FIBROUS SEROSAL ADHESIONS.  - ENDOMETRIOSIS.  - NEGATIVE FOR MALIGNANCY.  Subjective: Patient reports marked improvement in her preop symptoms. Eating a regular diet without difficulty. Dennie Bibleat has min LLQ pain w bending.  Some intestinal spasm prior to BM, then resolves quickly.  Activity: normal activities of daily living. Patient reports vaginal sx's of None  Objective: BP 120/80   Ht 5\' 5"  (1.651 m)   Wt 161 lb (73 kg)   LMP  (LMP Unknown)   BMI 26.79 kg/m  Physical Exam  Constitutional: She is oriented to person, place, and time. She appears well-developed and well-nourished. No distress.  Cardiovascular: Normal rate.  Pulmonary/Chest: Effort normal.  Abdominal: Soft. She exhibits no distension. There is no tenderness.  Incision Healing Well   Musculoskeletal: Normal range of motion.  Neurological: She is alert and oriented to person, place, and time. No cranial nerve deficit.  Skin: Skin is warm and dry.  Psychiatric: She has a normal mood and affect.   Assessment: s/p :  left oophorectomy progressing well  Plan: Patient has done well after surgery with no apparent complications.  I have discussed the post-operative course to date, and the expected progress moving forward.  The patient understands what complications to be concerned about.  I will see the patient in routine follow up, or sooner if needed.    Activity plan: No restriction.  Exam, MMG, Labs soon  Letitia LibraRobert Paul Bertran Zeimet 03/09/2018, 10:48 AM

## 2018-04-25 ENCOUNTER — Telehealth: Payer: Self-pay

## 2018-04-25 NOTE — Telephone Encounter (Signed)
Pt is going on a cruise next week & is requesting RPH to send in rx for scopolamine patch as he has done in the past. It's a 7 night cruise. She has apt for AE 05/11/18 Cb# w#[phone]. Cell#[phone]

## 2018-04-26 MED ORDER — SCOPOLAMINE 1 MG/3DAYS TD PT72: 1.0000 | MEDICATED_PATCH | TRANSDERMAL | 0 refills | Status: DC

## 2018-04-26 NOTE — Telephone Encounter (Signed)
Done

## 2018-05-11 ENCOUNTER — Encounter: Payer: Self-pay | Admitting: Obstetrics & Gynecology

## 2018-05-11 ENCOUNTER — Ambulatory Visit (INDEPENDENT_AMBULATORY_CARE_PROVIDER_SITE_OTHER): Payer: BLUE CROSS/BLUE SHIELD | Admitting: Obstetrics & Gynecology

## 2018-05-11 VITALS — BP 128/80 | Ht 64.0 in | Wt 164.0 lb

## 2018-05-11 DIAGNOSIS — Z13220 Encounter for screening for lipoid disorders: Secondary | ICD-10-CM

## 2018-05-11 DIAGNOSIS — Z01419 Encounter for gynecological examination (general) (routine) without abnormal findings: Secondary | ICD-10-CM

## 2018-05-11 DIAGNOSIS — Z23 Encounter for immunization: Secondary | ICD-10-CM

## 2018-05-11 DIAGNOSIS — Z131 Encounter for screening for diabetes mellitus: Secondary | ICD-10-CM

## 2018-05-11 DIAGNOSIS — Z1239 Encounter for other screening for malignant neoplasm of breast: Secondary | ICD-10-CM

## 2018-05-11 DIAGNOSIS — Z1321 Encounter for screening for nutritional disorder: Secondary | ICD-10-CM | POA: Diagnosis not present

## 2018-05-11 DIAGNOSIS — Z1329 Encounter for screening for other suspected endocrine disorder: Secondary | ICD-10-CM

## 2018-05-11 DIAGNOSIS — Z0000 Encounter for general adult medical examination without abnormal findings: Secondary | ICD-10-CM

## 2018-05-11 NOTE — Progress Notes (Signed)
HPI:      [name] is a 42 y.o. [phone] who LMP was No LMP recorded (lmp unknown). Patient has had a hysterectomy., she presents today for her annual examination. The patient has no complaints today. The patient is sexually active. Her last pap: was normal and she has not had MMG yet.  She is doing well since surgery for left oophorectomy and endometriosis- - - no pain; she has had prior hysterectomy.. The patient does perform self breast exams.  There is no notable family history of breast or ovarian cancer in her family.  The patient has regular exercise: yes.  The patient denies current symptoms of depression.    GYN History: Contraception: status post hysterectomy  PMHx: Past Medical History:  Diagnosis Date  . Anemia   . Complication of anesthesia   . Family history of adverse reaction to anesthesia    DAD AND PATERNAL GRANDFATHER N/V  . GERD (gastroesophageal reflux disease)   . Heart palpitations   . PONV (postoperative nausea and vomiting)   . Tachycardia    PT HAS HAD EKG'S AND WORN HOLTER 48 HOUR WHICH WERE ALL NORMAL   Past Surgical History:  Procedure Laterality Date  . CYSTOSCOPY N/A 01/29/2015   Procedure: CYSTOSCOPY;  Surgeon: Nadara Mustard, MD;  Location: [facility] ORS;  Service: Gynecology;  Laterality: N/A;  . LAPAROSCOPIC BILATERAL SALPINGECTOMY Bilateral 01/29/2015   Procedure: LAPAROSCOPIC BILATERAL SALPINGECTOMY;  Surgeon: Nadara Mustard, MD;  Location: [facility] ORS;  Service: Gynecology;  Laterality: Bilateral;  . LAPAROSCOPIC HYSTERECTOMY N/A 01/29/2015   Procedure: HYSTERECTOMY TOTAL LAPAROSCOPIC;  Surgeon: Nadara Mustard, MD;  Location: [facility] ORS;  Service: Gynecology;  Laterality: N/A;  . LAPAROSCOPIC LYSIS OF ADHESIONS  02/22/2018   Procedure: LAPAROSCOPIC LYSIS OF ADHESIONS;  Surgeon: Nadara Mustard, MD;  Location: [facility] ORS;  Service: Gynecology;;   Family History  Problem Relation Age of Onset  . Hypercholesterolemia Mother 76  . Irritable bowel  syndrome Sister    Social History   Tobacco Use  . Smoking status: Never Smoker  . Smokeless tobacco: Never Used  Substance Use Topics  . Alcohol use: No  . Drug use: No   No current outpatient medications on file. Allergies: Amoxicillin; Aspirin; Codeine; Sulfa antibiotics; Adhesive [tape]; and Penicillins  Review of Systems  Constitutional: Negative for chills, fever and malaise/fatigue.  HENT: Negative for congestion, sinus pain and sore throat.   Eyes: Negative for blurred vision and pain.  Respiratory: Negative for cough and wheezing.   Cardiovascular: Negative for chest pain and leg swelling.  Gastrointestinal: Negative for abdominal pain, constipation, diarrhea, heartburn, nausea and vomiting.  Genitourinary: Negative for dysuria, frequency, hematuria and urgency.  Musculoskeletal: Negative for back pain, joint pain, myalgias and neck pain.  Skin: Negative for itching and rash.  Neurological: Negative for dizziness, tremors and weakness.  Endo/Heme/Allergies: Does not bruise/bleed easily.  Psychiatric/Behavioral: Negative for depression. The patient is not nervous/anxious and does not have insomnia.     Objective: BP 128/80   Ht 5\' 4"  (1.626 m)   Wt 164 lb (74.4 kg)   LMP  (LMP Unknown)   BMI 28.15 kg/m   Filed Weights   05/11/18 0840  Weight: 164 lb (74.4 kg)   Body mass index is 28.15 kg/m. Physical Exam  Constitutional: She is oriented to person, place, and time. She appears well-developed and well-nourished. No distress.  Genitourinary: Rectum normal and vagina normal. Pelvic exam was performed with patient supine. There is  no rash or lesion on the right labia. There is no rash or lesion on the left labia. Vagina exhibits no lesion. No bleeding in the vagina. Right adnexum does not display mass and does not display tenderness. Left adnexum does not display mass and does not display tenderness.  Genitourinary Comments: Absent Uterus Absent cervix Vaginal cuff  well healed  HENT:  Head: Normocephalic and atraumatic. Head is without laceration.  Right Ear: Hearing normal.  Left Ear: Hearing normal.  Nose: No epistaxis.  No foreign bodies.  Mouth/Throat: Uvula is midline, oropharynx is clear and moist and mucous membranes are normal.  Eyes: Pupils are equal, round, and reactive to light.  Neck: Normal range of motion. Neck supple. No thyromegaly present.  Cardiovascular: Normal rate and regular rhythm. Exam reveals no gallop and no friction rub.  No murmur heard. Pulmonary/Chest: Effort normal and breath sounds normal. No respiratory distress. She has no wheezes. Right breast exhibits no mass, no skin change and no tenderness. Left breast exhibits no mass, no skin change and no tenderness.  Abdominal: Soft. Bowel sounds are normal. She exhibits no distension. There is no tenderness. There is no rebound.  Musculoskeletal: Normal range of motion.  Neurological: She is alert and oriented to person, place, and time. No cranial nerve deficit.  Skin: Skin is warm and dry.  Psychiatric: She has a normal mood and affect. Judgment normal.  Vitals reviewed.  Assessment:  ANNUAL EXAM 1. Annual physical exam   2. Screening for breast cancer   3. Need for immunization against influenza   4. Screening for thyroid disorder   5. Screening for diabetes mellitus   6. Screening for cholesterol level   7. Encounter for vitamin deficiency screening    Screening Plan:            1.  Cervical Screening-  Pap smear schedule reviewed with patient  2. Breast screening- Exam annually and mammogram>40 planned   3. Colonoscopy every 10 years, Hemoccult testing - after age 65  4. Labs Ordered today  5. Counseling for contraception: none needed  Other:  1. Annual physical exam  2. Screening for breast cancer - MM 3D SCREEN BREAST BILATERAL; Future  3. Need for immunization against influenza - Flu Vaccine QUAD 36+ mos IM  4. Screening for thyroid  disorder - TSH  5. Screening for diabetes mellitus - Hemoglobin A1c  6. Screening for cholesterol level - Lipid panel  7. Encounter for vitamin deficiency screening - VITAMIN D 25 Hydroxy (Vit-D Deficiency, Fractures)  8. Improved LLQ pain and endometriosis after surgery    F/U  Return in about 1 year (around 05/12/2019) for Annual.  Annamarie Major, MD, [facility] Frederick [facility], [facility] 05/11/2018  9:08 AM

## 2018-05-11 NOTE — Patient Instructions (Signed)
PAP every 5 years Mammogram every year    Call [phone] to schedule at [facility] Colonoscopy every 10 years after age 41 Labs today

## 2018-05-12 LAB — LIPID PANEL
Chol/HDL Ratio: 3.5 ratio (ref 0.0–4.4)
Cholesterol, Total: 168 mg/dL (ref 100–199)
HDL: 48 mg/dL (ref >39)
LDL CALC: 103 mg/dL — AB (ref 0–99)
TRIGLYCERIDES: 84 mg/dL (ref 0–149)
VLDL Cholesterol Cal: 17 mg/dL (ref 5–40)

## 2018-05-12 LAB — TSH: TSH: 1.68 u[IU]/mL (ref 0.450–4.500)

## 2018-05-12 LAB — VITAMIN D 25 HYDROXY (VIT D DEFICIENCY, FRACTURES): Vit D, 25-Hydroxy: 20.9 ng/mL — ABNORMAL LOW (ref 30.0–100.0)

## 2018-05-12 LAB — HEMOGLOBIN A1C
Est. average glucose Bld gHb Est-mCnc: 105 mg/dL
Hgb A1c MFr Bld: 5.3 % (ref 4.8–5.6)

## 2018-08-06 ENCOUNTER — Other Ambulatory Visit: Payer: Self-pay | Admitting: Obstetrics & Gynecology

## 2018-08-06 DIAGNOSIS — Z1239 Encounter for other screening for malignant neoplasm of breast: Secondary | ICD-10-CM

## 2018-08-14 DIAGNOSIS — R6889 Other general symptoms and signs: Secondary | ICD-10-CM | POA: Diagnosis not present

## 2018-08-14 DIAGNOSIS — J208 Acute bronchitis due to other specified organisms: Secondary | ICD-10-CM | POA: Diagnosis not present

## 2018-08-24 ENCOUNTER — Ambulatory Visit
Admission: RE | Admit: 2018-08-24 | Discharge: 2018-08-24 | Disposition: A | Discharge status: Home / Self Care | Payer: BLUE CROSS/BLUE SHIELD | Source: Ambulatory Visit | Attending: Obstetrics & Gynecology | Admitting: Obstetrics & Gynecology

## 2018-08-24 DIAGNOSIS — Z1231 Encounter for screening mammogram for malignant neoplasm of breast: Secondary | ICD-10-CM | POA: Diagnosis not present

## 2018-08-24 DIAGNOSIS — Z1239 Encounter for other screening for malignant neoplasm of breast: Secondary | ICD-10-CM | POA: Diagnosis not present

## 2018-12-24 DIAGNOSIS — Z20828 Contact with and (suspected) exposure to other viral communicable diseases: Secondary | ICD-10-CM | POA: Diagnosis not present

## 2019-06-24 ENCOUNTER — Other Ambulatory Visit: Payer: Self-pay

## 2019-06-24 DIAGNOSIS — Z20822 Contact with and (suspected) exposure to covid-19: Secondary | ICD-10-CM

## 2019-06-26 LAB — NOVEL CORONAVIRUS, NAA: SARS-CoV-2, NAA: NOT DETECTED

## 2019-07-28 ENCOUNTER — Telehealth: Payer: BLUE CROSS/BLUE SHIELD

## 2019-10-04 ENCOUNTER — Ambulatory Visit: Payer: BC Managed Care – PPO | Attending: Internal Medicine

## 2019-10-04 DIAGNOSIS — Z23 Encounter for immunization: Secondary | ICD-10-CM

## 2019-10-04 NOTE — Progress Notes (Signed)
   Covid-19 Vaccination Clinic  Name:  Elizabeth Shelton    MRN: [id number] DOB: [birth year]  10/04/2019  [name] was observed post Covid-19 immunization for 15 minutes without incident. She was provided with Vaccine Information Sheet and instruction to access the V-Safe system.   [name] was instructed to call 911 with any severe reactions post vaccine: Marland Kitchen Difficulty breathing  . Swelling of face and throat  . A fast heartbeat  . A bad rash all over body  . Dizziness and weakness   Immunizations Administered    Name Date Dose VIS Date Route   Moderna COVID-19 Vaccine 10/04/2019  9:57 AM 0.5 mL 06/25/2019 Intramuscular   Manufacturer: Moderna   Lot: [id number]   NDC: 93810-175-10

## 2019-10-09 IMAGING — MG DIGITAL SCREENING BILATERAL MAMMOGRAM WITH TOMO AND CAD
8 series · 8 of 24 positions shown · non-contrast
Comparison: None.

CLINICAL DATA: Screening.

EXAM:
DIGITAL SCREENING BILATERAL MAMMOGRAM WITH TOMO AND CAD

[L MLO synth-2D]
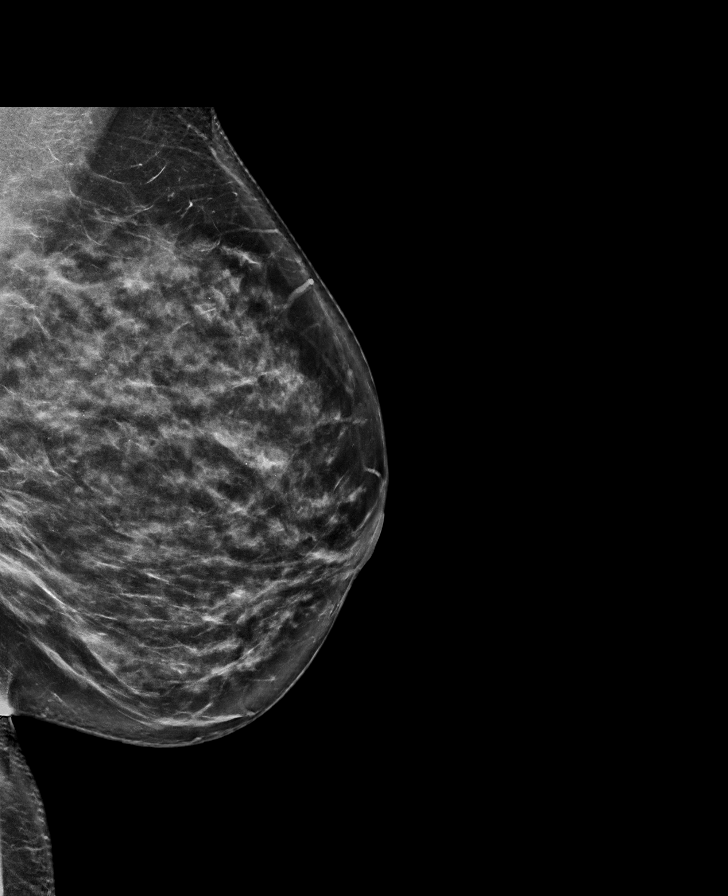

[L CC synth-2D]
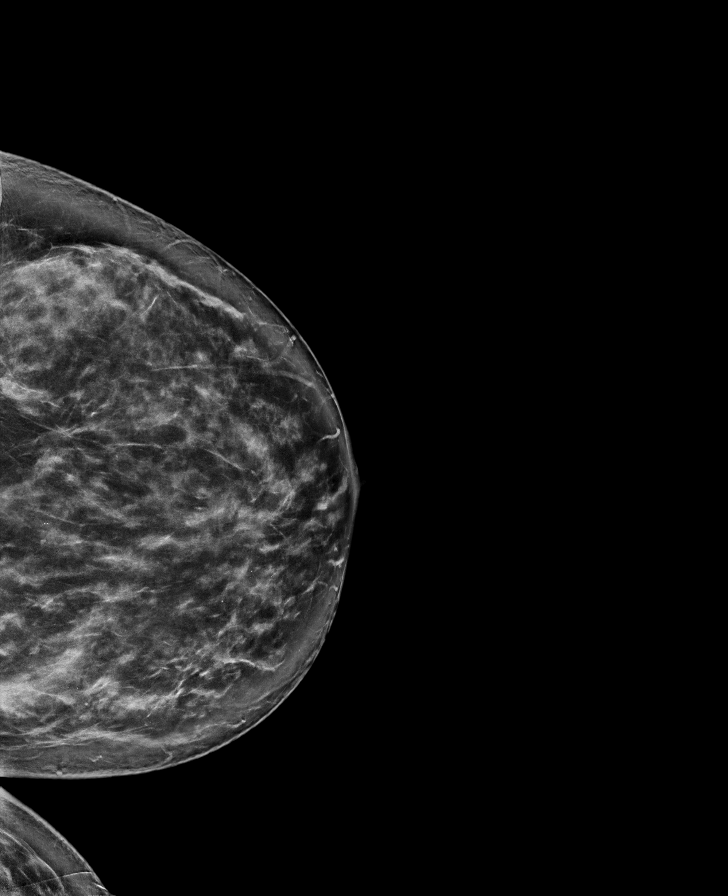

[R CC synth-2D]
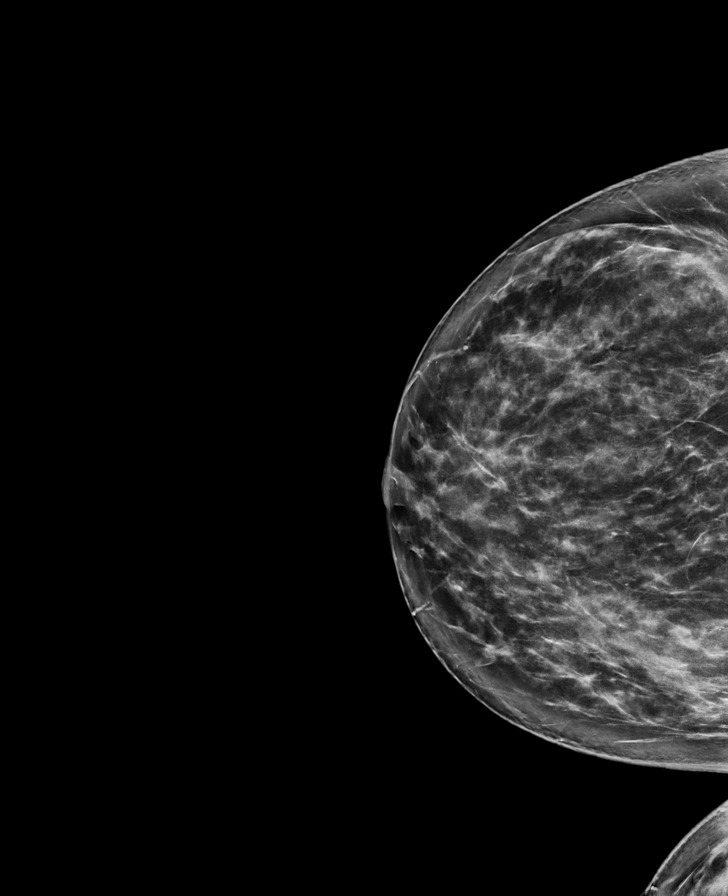

[R MLO synth-2D]
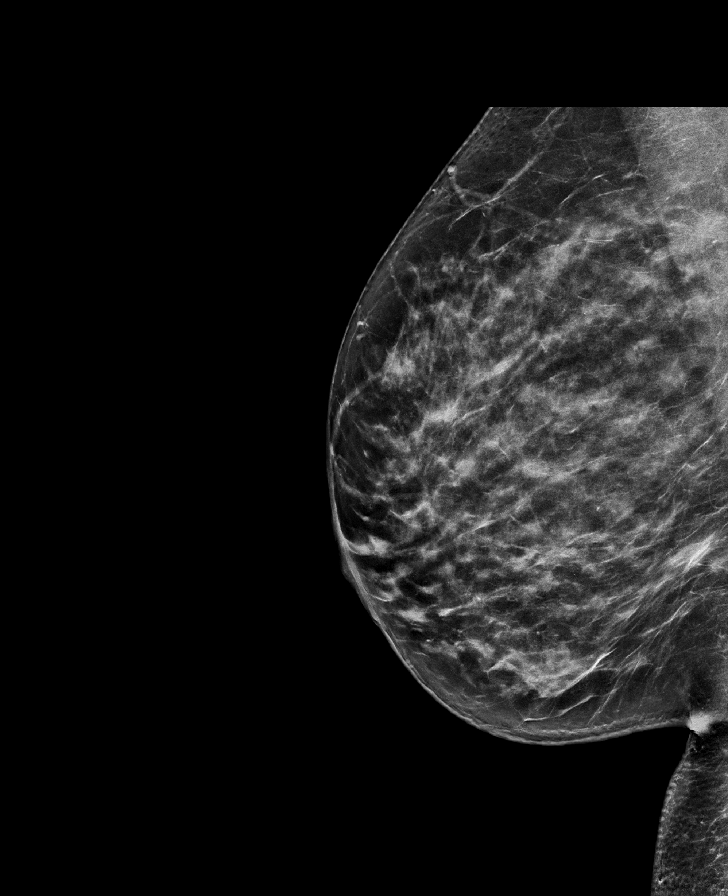

[L CC tomo · tomo slice 50/99.0]
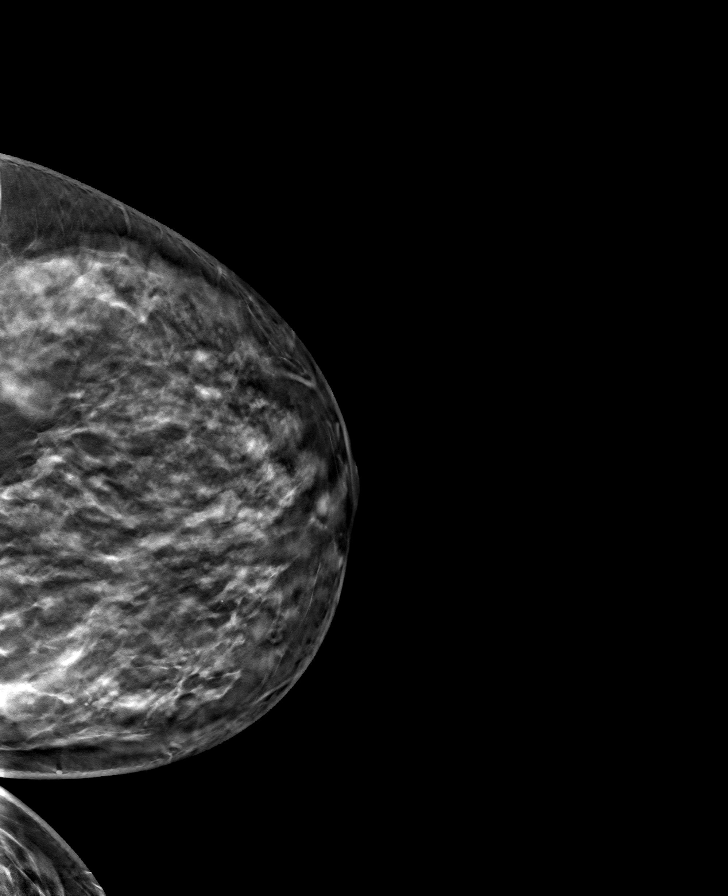

[R MLO tomo · tomo slice 39/78.0]
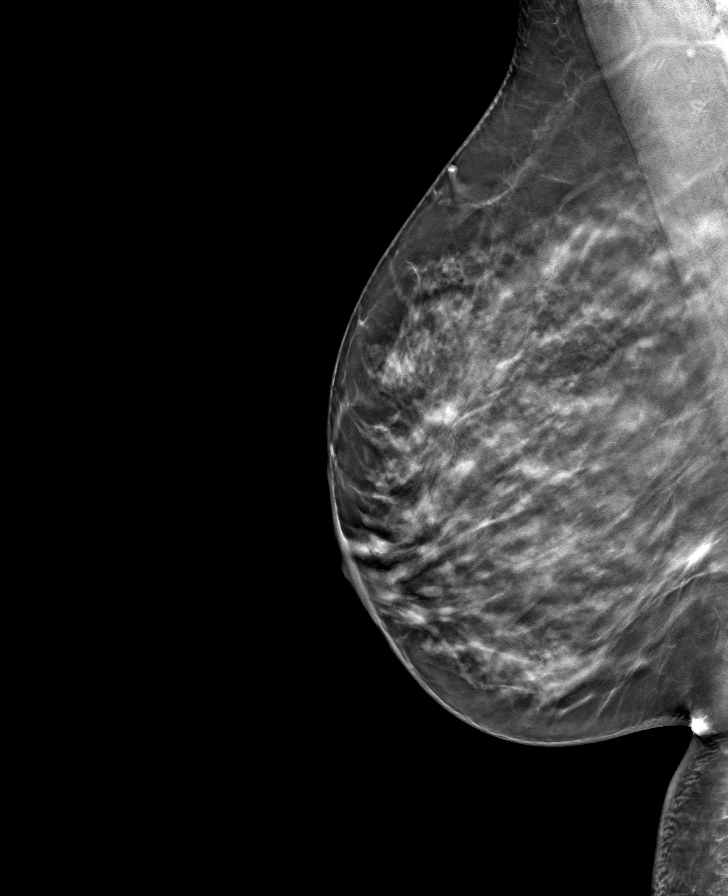

[L MLO tomo · tomo slice 46/91.0]
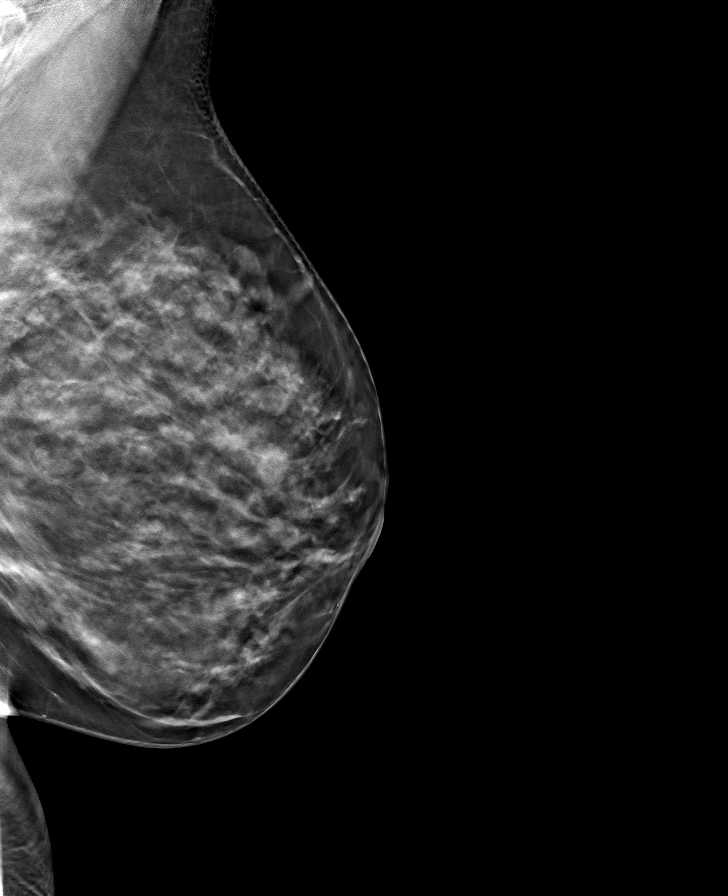

[R CC tomo · tomo slice 43/86.0]
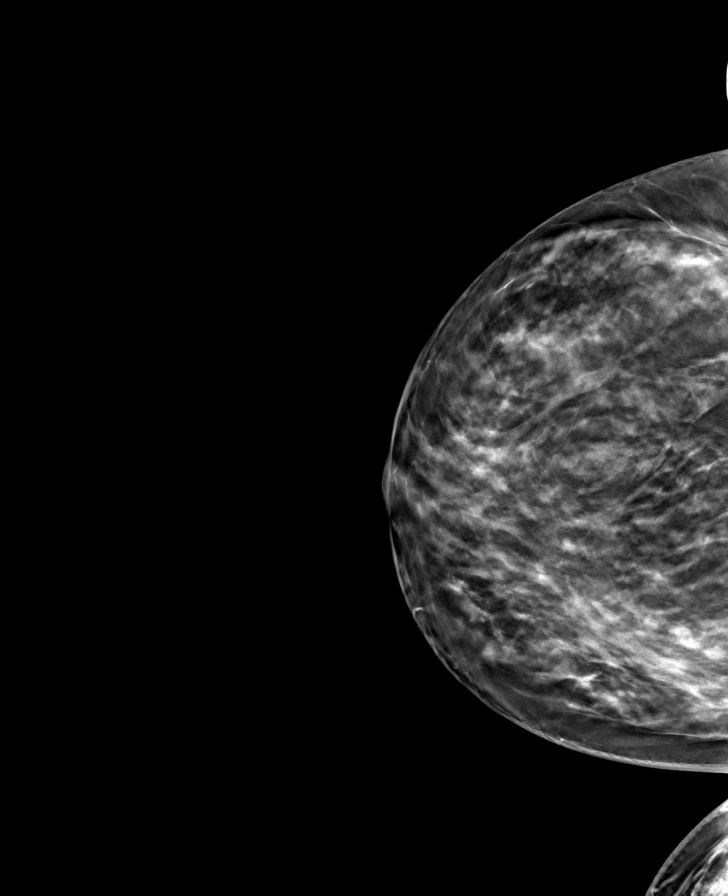

[8 of 24 positions shown; findings below may reference images not displayed]

ACR Breast Density Category c: The breast tissue is heterogeneously
dense, which may obscure small masses
FINDINGS: There are no findings suspicious for malignancy. Images were
processed with CAD.
IMPRESSION: No mammographic evidence of malignancy. A result letter of this
screening mammogram will be mailed directly to the patient.

RECOMMENDATION:
Screening mammogram in one year. (Code:EM-2-IHY)

BI-RADS CATEGORY  1: Negative.

## 2019-11-02 ENCOUNTER — Encounter: Payer: Self-pay | Admitting: Emergency Medicine

## 2019-11-02 ENCOUNTER — Other Ambulatory Visit: Payer: Self-pay

## 2019-11-02 ENCOUNTER — Emergency Department
Admission: EM | Admit: 2019-11-02 | Discharge: 2019-11-02 | Disposition: A | Discharge status: Home / Self Care | Payer: BC Managed Care – PPO | Attending: Emergency Medicine | Admitting: Emergency Medicine

## 2019-11-02 ENCOUNTER — Emergency Department: Payer: BC Managed Care – PPO

## 2019-11-02 DIAGNOSIS — R6884 Jaw pain: Secondary | ICD-10-CM | POA: Insufficient documentation

## 2019-11-02 DIAGNOSIS — R079 Chest pain, unspecified: Secondary | ICD-10-CM

## 2019-11-02 LAB — BASIC METABOLIC PANEL
Anion gap: 9 (ref 5–15)
BUN: 7 mg/dL (ref 6–20)
CO2: 23 mmol/L (ref 22–32)
Calcium: 9.1 mg/dL (ref 8.9–10.3)
Chloride: 106 mmol/L (ref 98–111)
Creatinine, Ser: 0.63 mg/dL (ref 0.44–1.00)
GFR calc Af Amer: >60 mL/min (ref >60)
GFR calc non Af Amer: >60 mL/min (ref >60)
Glucose, Bld: 134 mg/dL — ABNORMAL HIGH (ref 70–99)
Potassium: 3.5 mmol/L (ref 3.5–5.1)
Sodium: 138 mmol/L (ref 135–145)

## 2019-11-02 LAB — TROPONIN I (HIGH SENSITIVITY)
Troponin I (High Sensitivity): 3 ng/L (ref <18)
Troponin I (High Sensitivity): 3 ng/L (ref <18)

## 2019-11-02 LAB — CBC
HCT: 40.6 % (ref 36.0–46.0)
Hemoglobin: 13.8 g/dL (ref 12.0–15.0)
MCH: 30.1 pg (ref 26.0–34.0)
MCHC: 34 g/dL (ref 30.0–36.0)
MCV: 88.5 fL (ref 80.0–100.0)
Platelets: 366 10*3/uL (ref 150–400)
RBC: 4.59 MIL/uL (ref 3.87–5.11)
RDW: 12.1 % (ref 11.5–15.5)
WBC: 7.7 10*3/uL (ref 4.0–10.5)
nRBC: 0 % (ref 0.0–0.2)

## 2019-11-02 LAB — FIBRIN DERIVATIVES D-DIMER (ARMC ONLY): Fibrin derivatives D-dimer (ARMC): 418.91 ng/mL (FEU) (ref 0.00–499.00)

## 2019-11-02 MED ORDER — LIDOCAINE VISCOUS HCL 2 % MT SOLN
15.0000 mL | Freq: Once | OROMUCOSAL | Status: AC
  Administered 2019-11-02: 15 mL via ORAL
  Filled 2019-11-02: qty 15

## 2019-11-02 MED ORDER — SODIUM CHLORIDE 0.9% FLUSH: 3.0000 mL | Freq: Once | INTRAVENOUS | Status: DC

## 2019-11-02 MED ORDER — OXYCODONE HCL 5 MG PO TABS
5.0000 mg | ORAL_TABLET | Freq: Once | ORAL | Status: AC
  Administered 2019-11-02: 5 mg via ORAL
  Filled 2019-11-02: qty 1

## 2019-11-02 MED ORDER — TRAMADOL HCL 50 MG PO TABS: 50.0000 mg | ORAL_TABLET | Freq: Four times a day (QID) | ORAL | 0 refills | Status: DC | PRN

## 2019-11-02 MED ORDER — ALUM & MAG HYDROXIDE-SIMETH 200-200-20 MG/5ML PO SUSP
30.0000 mL | Freq: Once | ORAL | Status: AC
Start: 2019-11-02 — End: 2019-11-02
  Administered 2019-11-02: 18:00:00 30 mL via ORAL
  Filled 2019-11-02: qty 30

## 2019-11-02 MED ORDER — FAMOTIDINE 20 MG PO TABS: 20.0000 mg | ORAL_TABLET | Freq: Two times a day (BID) | ORAL | 0 refills | Status: DC

## 2019-11-02 NOTE — Discharge Instructions (Signed)
Please follow up with primary care and cardiology.  Return to the ER for pain that changes or worsens

## 2019-11-02 NOTE — ED Triage Notes (Signed)
Pt to ER with c/o midsternal chest pain that radiates into both jaws and left shoulder blade, arm.  Pt states pain started around 11:15 and is intermittently sharp and dull, but has been consistent since starting. Pt denies n/v/shob.

## 2019-11-02 NOTE — ED Notes (Addendum)
Removed EMS's 18G PIV from L hand d/t infiltration. Bleeding controlled, dressing applied.

## 2019-11-02 NOTE — ED Provider Notes (Signed)
[facility] Emergency Department Provider Note  ____________________________________________   First MD Initiated Contact with Patient 11/02/19 1647     (approximate)  I have reviewed the triage vital signs and the nursing notes.   HISTORY  Chief Complaint Chest Pain   HPI Elizabeth Shelton is a 44 y.o. female with a history of tachycardia, palpitations, and anemia presenting to the emergency department for treatment and evaluation of midsternal chest pain.  She states the pain started suddenly while she was sitting at the table coloring.  She states that she has also had some panic attacks before, however this is not similar in nature.  She states that when the pain started she began to get concerned because she had some pain radiating into her arm and jaw.  She denies shortness of breath, diaphoresis, or nausea.  No family members with cardiac related illness or death prior to age 66.  She does not smoke.  No hormone replacement or birth control.  No recent immobilization or long distance traveling.  She denies leg pain or swelling.  Pain does not get worse with deep breath.  Pain does not worsen with movement.  No alleviating measures attempted prior to arrival.  Past Medical History:  Diagnosis Date  . Anemia   . Complication of anesthesia   . Family history of adverse reaction to anesthesia    DAD AND PATERNAL GRANDFATHER N/V  . GERD (gastroesophageal reflux disease)   . Heart palpitations   . PONV (postoperative nausea and vomiting)   . Tachycardia    PT HAS HAD EKG'S AND WORN HOLTER 48 HOUR WHICH WERE ALL NORMAL    Patient Active Problem List   Diagnosis Date Noted  . LLQ pain 02/19/2018  . Panic attack 02/23/2015  . Endometriosis determined by laparoscopy 01/29/2015  . S/P hysterectomy 01/29/2015    Past Surgical History:  Procedure Laterality Date  . ABDOMINAL HYSTERECTOMY    . CYSTOSCOPY N/A 01/29/2015   Procedure: CYSTOSCOPY;  Surgeon:  Nadara Mustard, MD;  Location: [facility] ORS;  Service: Gynecology;  Laterality: N/A;  . LAPAROSCOPIC BILATERAL SALPINGECTOMY Bilateral 01/29/2015   Procedure: LAPAROSCOPIC BILATERAL SALPINGECTOMY;  Surgeon: Nadara Mustard, MD;  Location: [facility] ORS;  Service: Gynecology;  Laterality: Bilateral;  . LAPAROSCOPIC HYSTERECTOMY N/A 01/29/2015   Procedure: HYSTERECTOMY TOTAL LAPAROSCOPIC;  Surgeon: Nadara Mustard, MD;  Location: [facility] ORS;  Service: Gynecology;  Laterality: N/A;  . LAPAROSCOPIC LYSIS OF ADHESIONS  02/22/2018   Procedure: LAPAROSCOPIC LYSIS OF ADHESIONS;  Surgeon: Nadara Mustard, MD;  Location: [facility] ORS;  Service: Gynecology;;  . Alma Friendly      Prior to Admission medications   Medication Sig Start Date End Date Taking? Authorizing Provider  famotidine (PEPCID) 20 MG tablet Take 1 tablet (20 mg total) by mouth 2 (two) times daily. 11/02/19 11/01/20  Marcos Peloso, Kasandra Knudsen, FNP  traMADol (ULTRAM) 50 MG tablet Take 1 tablet (50 mg total) by mouth every 6 (six) hours as needed. 11/02/19   Kratos Ruscitti, Rulon Eisenmenger B, FNP    Allergies Amoxicillin, Aspirin, Codeine, Sulfa antibiotics, Adhesive [tape], and Penicillins  Family History  Problem Relation Age of Onset  . Hypercholesterolemia Mother 64  . Irritable bowel syndrome Sister     Social History Social History   Tobacco Use  . Smoking status: Never Smoker  . Smokeless tobacco: Never Used  Substance Use Topics  . Alcohol use: No  . Drug use: No    Review of Systems  Constitutional: No fever/chills.  Eyes: No visual changes. ENT: No sore throat. Cardiovascular: Positive for chest pain.  Negative for pleuritic pain.  Negative for palpitations.  Negative for leg pain. Respiratory: Negative shortness of breath. Gastrointestinal: Negative for abdominal pain.  No nausea, no vomiting.  No diarrhea.  No constipation. Genitourinary: Negative for dysuria. Musculoskeletal: Negative for back pain.  Skin: Negative for rash, lesion,  wound. Neurological: Negative for headaches, focal weakness or numbness. ____________________________________________   PHYSICAL EXAM:  VITAL SIGNS: ED Triage Vitals  Enc Vitals Group     BP 11/02/19 1229 134/81     Pulse Rate 11/02/19 1229 84     Resp 11/02/19 1229 18     Temp 11/02/19 1229 98.7 F (37.1 C)     Temp src --      SpO2 11/02/19 1229 98 %     Weight 11/02/19 1233 172 lb (78 kg)     Height 11/02/19 1233 5\' 4"  (1.626 m)     Head Circumference --      Peak Flow --      Pain Score 11/02/19 1233 3     Pain Loc --      Pain Edu? --      Excl. in GC? --     Constitutional: Alert and oriented.  Well appearing and in no acute distress.  Normal mental status. Eyes: Conjunctivae are normal. PERRL. Head: Atraumatic. Nose: No congestion/rhinnorhea. Mouth/Throat: Mucous membranes are moist.  Oropharynx non-erythematous. Tongue normal in size and color. Neck: No stridor.  No carotid bruit appreciated on exam. Hematological/Lymphatic/Immunilogical: No cervical lymphadenopathy. Cardiovascular: Normal rate, regular rhythm. Grossly normal heart sounds.  Good peripheral circulation. Respiratory: Normal respiratory effort.  No retractions. Lungs CTAB. Gastrointestinal: Soft and nontender. No distention. No abdominal bruits. No CVA tenderness. Genitourinary: Exam deferred. Musculoskeletal: No lower extremity tenderness.  No edema of extremities. Neurologic:  Normal speech and language. No gross focal neurologic deficits are appreciated. Skin:  Skin is warm, dry and intact. No rash noted. Psychiatric: Mood and affect are normal. Speech and behavior are normal.  ____________________________________________   LABS (all labs ordered are listed, but only abnormal results are displayed)  Labs Reviewed  BASIC METABOLIC PANEL - Abnormal; Notable for the following components:      Result Value   Glucose, Bld 134 (*)    All other components within normal limits  CBC  FIBRIN  DERIVATIVES D-DIMER ([facility] ONLY)  TROPONIN I (HIGH SENSITIVITY)  TROPONIN I (HIGH SENSITIVITY)   ____________________________________________  EKG  ED ECG REPORT I, Laurencia Roma, FNP-BC personally viewed and interpreted this ECG.   Date: 11/02/2019  EKG Time: 1228  Rate: 88  Rhythm: normal sinus rhythm  Axis: right  Intervals:none  ST&T Change: no ST elevation  ____________________________________________  RADIOLOGY  ED MD interpretation:  Chest x-ray negative for acute findings.  I, 1229, personally viewed and evaluated these images (plain radiographs) as part of my medical decision making, as well as reviewing the written report by the radiologist.  Official radiology report(s): DG Chest 2 View  Result Date: 11/02/2019 CLINICAL DATA:  Chest pain EXAM: CHEST - 2 VIEW COMPARISON:  None. FINDINGS: The heart size and mediastinal contours are within normal limits. Both lungs are clear. The visualized skeletal structures are unremarkable. IMPRESSION: No active cardiopulmonary disease. Electronically Signed   By: 01/02/2020 D.O.   On: 11/02/2019 13:17    ____________________________________________   PROCEDURES  Procedure(s) performed: None  Procedures  Critical Care performed: No  ____________________________________________  INITIAL IMPRESSION / ASSESSMENT AND PLAN / ED COURSE  44 year old female presenting to the emergency department for treatment and evaluation of midsternal chest pain.  See HPI for further details.  While awaiting ER room assignment, initial protocol labs were drawn.  Initial troponin is normal.  Patient is overall well-appearing however she states that this pain is intermittent.  She is not able to reproduce the pain with movement or deep breath.  With the exception of some palpitations she has had no cardiac history.  Plan will be to get a second troponin as well as add  D-dimer. ____________________________________________  Differential diagnosis includes, but not limited to:  NSTEMI, PE, musculoskeletal chest wall pain, GERD   FINAL CLINICAL IMPRESSION(S) / ED DIAGNOSES  Serial troponins, EKG, labs including D-dimer, and chest x-ray do not indicate cardiac event or pulmonary embolus.  Patient will be discharged home with instruction to follow-up with her primary care provider.  She will also be given information for cardiology and was recommended to be evaluated.  If chest pain changes, worsens, or she has other concerns, she is to return to the emergency department.  Final diagnoses:  Nonspecific chest pain     ED Discharge Orders         Ordered    famotidine (PEPCID) 20 MG tablet  2 times daily     11/02/19 1840    traMADol (ULTRAM) 50 MG tablet  Every 6 hours PRN     11/02/19 1840           Elizabeth Shelton was evaluated in Emergency Department on 11/02/2019 for the symptoms described in the history of present illness. She was evaluated in the context of the global COVID-19 pandemic, which necessitated consideration that the patient might be at risk for infection with the SARS-CoV-2 virus that causes COVID-19. Institutional protocols and algorithms that pertain to the evaluation of patients at risk for COVID-19 are in a state of rapid change based on information released by regulatory bodies including the CDC and federal and state organizations. These policies and algorithms were followed during the patient's care in the ED.   Note:  This document was prepared using Dragon voice recognition software and may include unintentional dictation errors.   Victorino Dike, FNP 11/02/19 1906    Blake Divine, MD 11/02/19 2215

## 2019-11-05 ENCOUNTER — Ambulatory Visit: Payer: BC Managed Care – PPO | Attending: Internal Medicine

## 2019-11-05 DIAGNOSIS — Z23 Encounter for immunization: Secondary | ICD-10-CM

## 2019-11-05 NOTE — Progress Notes (Signed)
   Covid-19 Vaccination Clinic  Name:  Elizabeth Shelton    MRN: [id number] DOB: [birth year]  11/05/2019  [name] was observed post Covid-19 immunization for 15 minutes without incident. She was provided with Vaccine Information Sheet and instruction to access the V-Safe system.   [name] was instructed to call 911 with any severe reactions post vaccine: Marland Kitchen Difficulty breathing  . Swelling of face and throat  . A fast heartbeat  . A bad rash all over body  . Dizziness and weakness   Immunizations Administered    Name Date Dose VIS Date Route   Moderna COVID-19 Vaccine 11/05/2019 10:02 AM 0.5 mL 06/25/2019 Intramuscular   Manufacturer: Moderna   Lot: [id number]   NDC: 11941-740-81

## 2021-05-03 DIAGNOSIS — R000 Tachycardia, unspecified: Secondary | ICD-10-CM | POA: Diagnosis not present

## 2021-05-05 DIAGNOSIS — R000 Tachycardia, unspecified: Secondary | ICD-10-CM | POA: Diagnosis not present

## 2021-05-05 DIAGNOSIS — R079 Chest pain, unspecified: Secondary | ICD-10-CM | POA: Diagnosis not present

## 2021-05-05 DIAGNOSIS — I491 Atrial premature depolarization: Secondary | ICD-10-CM | POA: Diagnosis not present

## 2021-05-30 DIAGNOSIS — H40013 Open angle with borderline findings, low risk, bilateral: Secondary | ICD-10-CM | POA: Diagnosis not present

## 2021-05-30 DIAGNOSIS — Z8669 Personal history of other diseases of the nervous system and sense organs: Secondary | ICD-10-CM | POA: Diagnosis not present

## 2021-05-30 DIAGNOSIS — H16042 Marginal corneal ulcer, left eye: Secondary | ICD-10-CM | POA: Diagnosis not present

## 2021-05-31 DIAGNOSIS — H40013 Open angle with borderline findings, low risk, bilateral: Secondary | ICD-10-CM | POA: Diagnosis not present

## 2021-05-31 DIAGNOSIS — Z8669 Personal history of other diseases of the nervous system and sense organs: Secondary | ICD-10-CM | POA: Diagnosis not present

## 2021-05-31 DIAGNOSIS — H16042 Marginal corneal ulcer, left eye: Secondary | ICD-10-CM | POA: Diagnosis not present

## 2021-06-01 DIAGNOSIS — H16042 Marginal corneal ulcer, left eye: Secondary | ICD-10-CM | POA: Diagnosis not present

## 2021-06-01 DIAGNOSIS — Z8669 Personal history of other diseases of the nervous system and sense organs: Secondary | ICD-10-CM | POA: Diagnosis not present

## 2021-06-01 DIAGNOSIS — H40013 Open angle with borderline findings, low risk, bilateral: Secondary | ICD-10-CM | POA: Diagnosis not present

## 2021-06-02 DIAGNOSIS — Z8669 Personal history of other diseases of the nervous system and sense organs: Secondary | ICD-10-CM | POA: Diagnosis not present

## 2021-06-02 DIAGNOSIS — H40013 Open angle with borderline findings, low risk, bilateral: Secondary | ICD-10-CM | POA: Diagnosis not present

## 2021-06-02 DIAGNOSIS — H16042 Marginal corneal ulcer, left eye: Secondary | ICD-10-CM | POA: Diagnosis not present

## 2021-06-07 DIAGNOSIS — H16042 Marginal corneal ulcer, left eye: Secondary | ICD-10-CM | POA: Diagnosis not present

## 2021-06-07 DIAGNOSIS — Z8669 Personal history of other diseases of the nervous system and sense organs: Secondary | ICD-10-CM | POA: Diagnosis not present

## 2021-06-07 DIAGNOSIS — H40013 Open angle with borderline findings, low risk, bilateral: Secondary | ICD-10-CM | POA: Diagnosis not present

## 2021-08-20 DIAGNOSIS — L718 Other rosacea: Secondary | ICD-10-CM | POA: Diagnosis not present

## 2021-08-20 DIAGNOSIS — Z792 Long term (current) use of antibiotics: Secondary | ICD-10-CM | POA: Diagnosis not present

## 2021-11-19 DIAGNOSIS — L718 Other rosacea: Secondary | ICD-10-CM | POA: Diagnosis not present

## 2022-03-25 DIAGNOSIS — K219 Gastro-esophageal reflux disease without esophagitis: Secondary | ICD-10-CM | POA: Diagnosis not present

## 2022-03-25 DIAGNOSIS — R000 Tachycardia, unspecified: Secondary | ICD-10-CM | POA: Diagnosis not present

## 2022-03-25 DIAGNOSIS — R079 Chest pain, unspecified: Secondary | ICD-10-CM | POA: Diagnosis not present

## 2022-03-25 DIAGNOSIS — I491 Atrial premature depolarization: Secondary | ICD-10-CM | POA: Diagnosis not present

## 2022-05-16 DIAGNOSIS — N390 Urinary tract infection, site not specified: Secondary | ICD-10-CM | POA: Diagnosis not present

## 2022-05-16 DIAGNOSIS — A499 Bacterial infection, unspecified: Secondary | ICD-10-CM | POA: Diagnosis not present

## 2022-05-16 DIAGNOSIS — R110 Nausea: Secondary | ICD-10-CM | POA: Diagnosis not present

## 2022-05-16 DIAGNOSIS — R300 Dysuria: Secondary | ICD-10-CM | POA: Diagnosis not present

## 2022-09-16 DIAGNOSIS — I491 Atrial premature depolarization: Secondary | ICD-10-CM | POA: Diagnosis not present

## 2022-09-16 DIAGNOSIS — K219 Gastro-esophageal reflux disease without esophagitis: Secondary | ICD-10-CM | POA: Diagnosis not present

## 2022-09-16 DIAGNOSIS — R000 Tachycardia, unspecified: Secondary | ICD-10-CM | POA: Diagnosis not present

## 2022-09-16 DIAGNOSIS — R079 Chest pain, unspecified: Secondary | ICD-10-CM | POA: Diagnosis not present

## 2022-11-18 DIAGNOSIS — L298 Other pruritus: Secondary | ICD-10-CM | POA: Diagnosis not present

## 2022-11-18 DIAGNOSIS — L718 Other rosacea: Secondary | ICD-10-CM | POA: Diagnosis not present

## 2022-11-18 DIAGNOSIS — L821 Other seborrheic keratosis: Secondary | ICD-10-CM | POA: Diagnosis not present

## 2023-01-29 ENCOUNTER — Other Ambulatory Visit: Payer: Self-pay

## 2023-01-29 ENCOUNTER — Encounter
Admission: EM | Disposition: A | Discharge status: Home / Self Care | Payer: Self-pay | Source: Home / Self Care | Attending: Emergency Medicine

## 2023-01-29 ENCOUNTER — Observation Stay: Payer: BC Managed Care – PPO | Admitting: Anesthesiology

## 2023-01-29 ENCOUNTER — Emergency Department: Payer: BC Managed Care – PPO

## 2023-01-29 ENCOUNTER — Observation Stay
Admission: EM | Admit: 2023-01-29 | Discharge: 2023-01-31 | Disposition: A | Discharge status: Home / Self Care | Payer: BC Managed Care – PPO | Attending: General Surgery | Admitting: General Surgery

## 2023-01-29 ENCOUNTER — Observation Stay: Payer: BC Managed Care – PPO

## 2023-01-29 DIAGNOSIS — R1012 Left upper quadrant pain: Secondary | ICD-10-CM | POA: Diagnosis not present

## 2023-01-29 DIAGNOSIS — K810 Acute cholecystitis: Principal | ICD-10-CM | POA: Diagnosis present

## 2023-01-29 DIAGNOSIS — R000 Tachycardia, unspecified: Secondary | ICD-10-CM | POA: Diagnosis not present

## 2023-01-29 DIAGNOSIS — K8012 Calculus of gallbladder with acute and chronic cholecystitis without obstruction: Secondary | ICD-10-CM | POA: Diagnosis not present

## 2023-01-29 DIAGNOSIS — R1011 Right upper quadrant pain: Secondary | ICD-10-CM | POA: Diagnosis not present

## 2023-01-29 DIAGNOSIS — R1010 Upper abdominal pain, unspecified: Secondary | ICD-10-CM | POA: Diagnosis not present

## 2023-01-29 DIAGNOSIS — K828 Other specified diseases of gallbladder: Secondary | ICD-10-CM | POA: Diagnosis not present

## 2023-01-29 DIAGNOSIS — K838 Other specified diseases of biliary tract: Secondary | ICD-10-CM | POA: Diagnosis not present

## 2023-01-29 DIAGNOSIS — K8020 Calculus of gallbladder without cholecystitis without obstruction: Secondary | ICD-10-CM | POA: Diagnosis not present

## 2023-01-29 DIAGNOSIS — R188 Other ascites: Secondary | ICD-10-CM | POA: Diagnosis not present

## 2023-01-29 HISTORY — PX: INTRAOPERATIVE CHOLANGIOGRAM: SHX5230

## 2023-01-29 LAB — URINALYSIS, W/ REFLEX TO CULTURE (INFECTION SUSPECTED)
Bilirubin Urine: NEGATIVE
Glucose, UA: NEGATIVE mg/dL
Hgb urine dipstick: NEGATIVE
Ketones, ur: NEGATIVE mg/dL
Leukocytes,Ua: NEGATIVE
Nitrite: NEGATIVE
Protein, ur: NEGATIVE mg/dL
Specific Gravity, Urine: 1.016 (ref 1.005–1.030)
pH: 6 (ref 5.0–8.0)

## 2023-01-29 LAB — CBC
HCT: 38.8 % (ref 36.0–46.0)
Hemoglobin: 12.9 g/dL (ref 12.0–15.0)
MCH: 30.1 pg (ref 26.0–34.0)
MCHC: 33.2 g/dL (ref 30.0–36.0)
MCV: 90.7 fL (ref 80.0–100.0)
Platelets: 349 10*3/uL (ref 150–400)
RBC: 4.28 MIL/uL (ref 3.87–5.11)
RDW: 12 % (ref 11.5–15.5)
WBC: 10.4 10*3/uL (ref 4.0–10.5)
nRBC: 0 % (ref 0.0–0.2)

## 2023-01-29 LAB — COMPREHENSIVE METABOLIC PANEL
ALT: 25 U/L (ref 0–44)
AST: 22 U/L (ref 15–41)
Albumin: 3.6 g/dL (ref 3.5–5.0)
Alkaline Phosphatase: 73 U/L (ref 38–126)
Anion gap: 9 (ref 5–15)
BUN: 18 mg/dL (ref 6–20)
CO2: 22 mmol/L (ref 22–32)
Calcium: 9 mg/dL (ref 8.9–10.3)
Chloride: 108 mmol/L (ref 98–111)
Creatinine, Ser: 0.61 mg/dL (ref 0.44–1.00)
GFR, Estimated: >60 mL/min (ref >60)
Glucose, Bld: 140 mg/dL — ABNORMAL HIGH (ref 70–99)
Potassium: 4.1 mmol/L (ref 3.5–5.1)
Sodium: 139 mmol/L (ref 135–145)
Total Bilirubin: 0.3 mg/dL (ref 0.3–1.2)
Total Protein: 7.5 g/dL (ref 6.5–8.1)

## 2023-01-29 LAB — LACTIC ACID, PLASMA: Lactic Acid, Venous: 1 mmol/L (ref 0.5–1.9)

## 2023-01-29 LAB — LIPASE, BLOOD: Lipase: 38 U/L (ref 11–51)

## 2023-01-29 LAB — TROPONIN I (HIGH SENSITIVITY)
Troponin I (High Sensitivity): 3 ng/L (ref <18)
Troponin I (High Sensitivity): 3 ng/L (ref <18)

## 2023-01-29 SURGERY — CHOLECYSTECTOMY, ROBOT-ASSISTED, LAPAROSCOPIC
Anesthesia: General

## 2023-01-29 MED ORDER — LACTATED RINGERS IV SOLN: INTRAVENOUS | Status: DC

## 2023-01-29 MED ORDER — INDOCYANINE GREEN 25 MG IV SOLR
INTRAVENOUS | Status: AC
  Filled 2023-01-29: qty 10

## 2023-01-29 MED ORDER — PROPOFOL 500 MG/50ML IV EMUL
INTRAVENOUS | Status: DC | PRN
  Administered 2023-01-29: 120 ug/kg/min via INTRAVENOUS

## 2023-01-29 MED ORDER — MIDAZOLAM HCL 2 MG/2ML IJ SOLN
INTRAMUSCULAR | Status: AC
  Filled 2023-01-29: qty 2

## 2023-01-29 MED ORDER — CEFAZOLIN SODIUM-DEXTROSE 2-4 GM/100ML-% IV SOLN
2.0000 g | Freq: Once | INTRAVENOUS | Status: AC
  Administered 2023-01-29: 2 g via INTRAVENOUS
  Filled 2023-01-29: qty 100

## 2023-01-29 MED ORDER — HYDROCODONE-ACETAMINOPHEN 5-325 MG PO TABS: 1.0000 | ORAL_TABLET | ORAL | Status: DC | PRN

## 2023-01-29 MED ORDER — ACETAMINOPHEN 650 MG RE SUPP: 650.0000 mg | Freq: Four times a day (QID) | RECTAL | Status: DC | PRN

## 2023-01-29 MED ORDER — VISTASEAL 10 ML SINGLE DOSE KIT
PACK | CUTANEOUS | Status: DC | PRN
  Administered 2023-01-29: 10 mL via TOPICAL

## 2023-01-29 MED ORDER — KETOROLAC TROMETHAMINE 30 MG/ML IJ SOLN
INTRAMUSCULAR | Status: AC
  Filled 2023-01-29: qty 1

## 2023-01-29 MED ORDER — OXYCODONE HCL 5 MG PO TABS
ORAL_TABLET | ORAL | Status: AC
  Filled 2023-01-29: qty 1

## 2023-01-29 MED ORDER — SUGAMMADEX SODIUM 200 MG/2ML IV SOLN
INTRAVENOUS | Status: DC | PRN
  Administered 2023-01-29: 200 mg via INTRAVENOUS

## 2023-01-29 MED ORDER — PROPOFOL 10 MG/ML IV BOLUS
INTRAVENOUS | Status: DC | PRN
  Administered 2023-01-29: 120 mg via INTRAVENOUS

## 2023-01-29 MED ORDER — FENTANYL CITRATE PF 50 MCG/ML IJ SOSY
50.0000 ug | PREFILLED_SYRINGE | Freq: Once | INTRAMUSCULAR | Status: AC
  Administered 2023-01-29: 50 ug via INTRAVENOUS
  Filled 2023-01-29: qty 1

## 2023-01-29 MED ORDER — PROPOFOL 1000 MG/100ML IV EMUL
INTRAVENOUS | Status: AC
  Filled 2023-01-29: qty 100

## 2023-01-29 MED ORDER — DEXAMETHASONE SODIUM PHOSPHATE 10 MG/ML IJ SOLN
INTRAMUSCULAR | Status: DC | PRN
  Administered 2023-01-29: 10 mg via INTRAVENOUS

## 2023-01-29 MED ORDER — CEFAZOLIN SODIUM-DEXTROSE 2-4 GM/100ML-% IV SOLN
INTRAVENOUS | Status: AC
  Filled 2023-01-29: qty 100

## 2023-01-29 MED ORDER — HYDROMORPHONE HCL 1 MG/ML IJ SOLN
0.5000 mg | INTRAMUSCULAR | Status: DC | PRN
  Administered 2023-01-29 (×2): 0.5 mg via INTRAVENOUS
  Filled 2023-01-29 (×2): qty 0.5

## 2023-01-29 MED ORDER — DEXMEDETOMIDINE HCL IN NACL 80 MCG/20ML IV SOLN
INTRAVENOUS | Status: AC
  Filled 2023-01-29: qty 20

## 2023-01-29 MED ORDER — LACTATED RINGERS IV SOLN: INTRAVENOUS | Status: DC | PRN

## 2023-01-29 MED ORDER — FENTANYL CITRATE (PF) 100 MCG/2ML IJ SOLN
INTRAMUSCULAR | Status: AC
  Filled 2023-01-29: qty 2

## 2023-01-29 MED ORDER — LIDOCAINE HCL (PF) 2 % IJ SOLN
INTRAMUSCULAR | Status: AC
  Filled 2023-01-29: qty 5

## 2023-01-29 MED ORDER — KETAMINE HCL 50 MG/5ML IJ SOSY
PREFILLED_SYRINGE | INTRAMUSCULAR | Status: AC
  Filled 2023-01-29: qty 5

## 2023-01-29 MED ORDER — METRONIDAZOLE 500 MG/100ML IV SOLN
500.0000 mg | Freq: Once | INTRAVENOUS | Status: AC
  Administered 2023-01-29: 500 mg via INTRAVENOUS
  Filled 2023-01-29: qty 100

## 2023-01-29 MED ORDER — OXYCODONE HCL 5 MG PO TABS
5.0000 mg | ORAL_TABLET | Freq: Once | ORAL | Status: AC | PRN
  Administered 2023-01-29: 5 mg via ORAL

## 2023-01-29 MED ORDER — ENOXAPARIN SODIUM 40 MG/0.4ML IJ SOSY: 40.0000 mg | PREFILLED_SYRINGE | INTRAMUSCULAR | Status: DC

## 2023-01-29 MED ORDER — ACETAMINOPHEN 10 MG/ML IV SOLN
1000.0000 mg | Freq: Once | INTRAVENOUS | Status: DC | PRN
  Administered 2023-01-29: 1000 mg via INTRAVENOUS

## 2023-01-29 MED ORDER — MIDAZOLAM HCL 2 MG/2ML IJ SOLN
INTRAMUSCULAR | Status: DC | PRN
  Administered 2023-01-29: 2 mg via INTRAVENOUS

## 2023-01-29 MED ORDER — ONDANSETRON 4 MG PO TBDP: 4.0000 mg | ORAL_TABLET | Freq: Four times a day (QID) | ORAL | Status: DC | PRN

## 2023-01-29 MED ORDER — ONDANSETRON HCL 4 MG/2ML IJ SOLN
INTRAMUSCULAR | Status: DC | PRN
  Administered 2023-01-29: 4 mg via INTRAVENOUS

## 2023-01-29 MED ORDER — EPHEDRINE 5 MG/ML INJ
INTRAVENOUS | Status: AC
  Filled 2023-01-29: qty 5

## 2023-01-29 MED ORDER — ENOXAPARIN SODIUM 40 MG/0.4ML IJ SOSY
40.0000 mg | PREFILLED_SYRINGE | INTRAMUSCULAR | Status: DC
  Administered 2023-01-30 – 2023-01-31 (×2): 40 mg via SUBCUTANEOUS
  Filled 2023-01-29 (×2): qty 0.4

## 2023-01-29 MED ORDER — SODIUM CHLORIDE 0.9 % IV SOLN
2.0000 g | Freq: Once | INTRAVENOUS | Status: AC
  Administered 2023-01-29: 2 g via INTRAVENOUS
  Filled 2023-01-29: qty 20

## 2023-01-29 MED ORDER — ROCURONIUM BROMIDE 100 MG/10ML IV SOLN
INTRAVENOUS | Status: DC | PRN
  Administered 2023-01-29: 10 mg via INTRAVENOUS
  Administered 2023-01-29: 50 mg via INTRAVENOUS
  Administered 2023-01-29: 20 mg via INTRAVENOUS
  Administered 2023-01-29 (×2): 10 mg via INTRAVENOUS

## 2023-01-29 MED ORDER — 0.9 % SODIUM CHLORIDE (POUR BTL) OPTIME
TOPICAL | Status: DC | PRN
  Administered 2023-01-29: 1000 mL
  Administered 2023-01-29: 500 mL

## 2023-01-29 MED ORDER — PROPOFOL 10 MG/ML IV BOLUS
INTRAVENOUS | Status: AC
  Filled 2023-01-29: qty 20

## 2023-01-29 MED ORDER — DEXAMETHASONE SODIUM PHOSPHATE 10 MG/ML IJ SOLN
INTRAMUSCULAR | Status: AC
  Filled 2023-01-29: qty 1

## 2023-01-29 MED ORDER — DEXMEDETOMIDINE HCL IN NACL 80 MCG/20ML IV SOLN
INTRAVENOUS | Status: DC | PRN
  Administered 2023-01-29: 12 ug via INTRAVENOUS

## 2023-01-29 MED ORDER — ONDANSETRON HCL 4 MG/2ML IJ SOLN
INTRAMUSCULAR | Status: AC
  Filled 2023-01-29: qty 2

## 2023-01-29 MED ORDER — BUPIVACAINE-EPINEPHRINE 0.25% -1:200000 IJ SOLN
INTRAMUSCULAR | Status: DC | PRN
  Administered 2023-01-29: 11 mL
  Administered 2023-01-29: 19 mL

## 2023-01-29 MED ORDER — OXYCODONE-ACETAMINOPHEN 5-325 MG PO TABS
1.0000 | ORAL_TABLET | ORAL | Status: DC | PRN
  Administered 2023-01-30 – 2023-01-31 (×7): 1 via ORAL
  Administered 2023-01-31: 2 via ORAL
  Filled 2023-01-29: qty 2
  Filled 2023-01-29 (×3): qty 1
  Filled 2023-01-29: qty 2
  Filled 2023-01-29 (×3): qty 1
  Filled 2023-01-29: qty 2

## 2023-01-29 MED ORDER — ONDANSETRON HCL 4 MG/2ML IJ SOLN: 4.0000 mg | Freq: Four times a day (QID) | INTRAMUSCULAR | Status: DC | PRN

## 2023-01-29 MED ORDER — PANTOPRAZOLE SODIUM 40 MG IV SOLR: 40.0000 mg | Freq: Every day | INTRAVENOUS | Status: DC

## 2023-01-29 MED ORDER — SCOPOLAMINE 1 MG/3DAYS TD PT72
1.0000 | MEDICATED_PATCH | TRANSDERMAL | Status: DC
  Administered 2023-01-29: 1.5 mg via TRANSDERMAL

## 2023-01-29 MED ORDER — LIDOCAINE HCL (CARDIAC) PF 100 MG/5ML IV SOSY
PREFILLED_SYRINGE | INTRAVENOUS | Status: DC | PRN
  Administered 2023-01-29: 80 mg via INTRAVENOUS

## 2023-01-29 MED ORDER — IOHEXOL 300 MG/ML  SOLN
100.0000 mL | Freq: Once | INTRAMUSCULAR | Status: AC | PRN
  Administered 2023-01-29: 100 mL via INTRAVENOUS

## 2023-01-29 MED ORDER — ONDANSETRON HCL 4 MG/2ML IJ SOLN
4.0000 mg | Freq: Four times a day (QID) | INTRAMUSCULAR | Status: DC | PRN
  Administered 2023-01-29 – 2023-01-30 (×2): 4 mg via INTRAVENOUS
  Filled 2023-01-29 (×2): qty 2

## 2023-01-29 MED ORDER — ROCURONIUM BROMIDE 10 MG/ML (PF) SYRINGE
PREFILLED_SYRINGE | INTRAVENOUS | Status: AC
  Filled 2023-01-29: qty 10

## 2023-01-29 MED ORDER — KETOROLAC TROMETHAMINE 30 MG/ML IJ SOLN
INTRAMUSCULAR | Status: DC | PRN
  Administered 2023-01-29: 30 mg via INTRAVENOUS

## 2023-01-29 MED ORDER — ACETAMINOPHEN 10 MG/ML IV SOLN
INTRAVENOUS | Status: AC
  Filled 2023-01-29: qty 100

## 2023-01-29 MED ORDER — ONDANSETRON HCL 4 MG/2ML IJ SOLN
INTRAMUSCULAR | Status: AC
  Administered 2023-01-29: 4 mg via INTRAVENOUS
  Filled 2023-01-29: qty 2

## 2023-01-29 MED ORDER — PANTOPRAZOLE SODIUM 40 MG IV SOLR
40.0000 mg | Freq: Once | INTRAVENOUS | Status: AC
  Administered 2023-01-29: 40 mg via INTRAVENOUS
  Filled 2023-01-29: qty 10

## 2023-01-29 MED ORDER — ACETAMINOPHEN 325 MG PO TABS: 650.0000 mg | ORAL_TABLET | Freq: Four times a day (QID) | ORAL | Status: DC | PRN

## 2023-01-29 MED ORDER — FENTANYL CITRATE (PF) 100 MCG/2ML IJ SOLN
INTRAMUSCULAR | Status: DC | PRN
  Administered 2023-01-29 (×4): 50 ug via INTRAVENOUS

## 2023-01-29 MED ORDER — ONDANSETRON HCL 4 MG/2ML IJ SOLN: 4.0000 mg | Freq: Once | INTRAMUSCULAR | Status: DC | PRN

## 2023-01-29 MED ORDER — EPHEDRINE SULFATE (PRESSORS) 50 MG/ML IJ SOLN
INTRAMUSCULAR | Status: DC | PRN
  Administered 2023-01-29: 20 mg via INTRAVENOUS

## 2023-01-29 MED ORDER — OXYCODONE HCL 5 MG/5ML PO SOLN: 5.0000 mg | Freq: Once | ORAL | Status: AC | PRN

## 2023-01-29 MED ORDER — KETAMINE HCL 10 MG/ML IJ SOLN
INTRAMUSCULAR | Status: DC | PRN
  Administered 2023-01-29: 20 mg via INTRAVENOUS
  Administered 2023-01-29: 10 mg via INTRAVENOUS
  Administered 2023-01-29: 20 mg via INTRAVENOUS

## 2023-01-29 MED ORDER — PANTOPRAZOLE SODIUM 40 MG IV SOLR
40.0000 mg | Freq: Every day | INTRAVENOUS | Status: DC
  Administered 2023-01-29 – 2023-01-30 (×2): 40 mg via INTRAVENOUS
  Filled 2023-01-29 (×2): qty 10

## 2023-01-29 MED ORDER — SCOPOLAMINE 1 MG/3DAYS TD PT72
MEDICATED_PATCH | TRANSDERMAL | Status: AC
  Filled 2023-01-29: qty 1

## 2023-01-29 MED ORDER — ONDANSETRON HCL 4 MG/2ML IJ SOLN: 4.0000 mg | Freq: Once | INTRAMUSCULAR | Status: AC

## 2023-01-29 MED ORDER — IOHEXOL 180 MG/ML  SOLN
INTRAMUSCULAR | Status: DC | PRN
  Administered 2023-01-29: 19 mL

## 2023-01-29 MED ORDER — FENTANYL CITRATE (PF) 100 MCG/2ML IJ SOLN
25.0000 ug | INTRAMUSCULAR | Status: DC | PRN
  Administered 2023-01-29 (×4): 25 ug via INTRAVENOUS

## 2023-01-29 MED ORDER — INDOCYANINE GREEN 25 MG IV SOLR
1.2500 mg | Freq: Once | INTRAVENOUS | Status: AC
  Administered 2023-01-29: 1.25 mg via INTRAVENOUS

## 2023-01-29 SURGICAL SUPPLY — 59 items
ADH SKN CLS APL DERMABOND .7 (GAUZE/BANDAGES/DRESSINGS) ×1
APL LAPSCP 35 DL APL RGD (MISCELLANEOUS) ×1
APPLICATOR VISTASEAL 35 (MISCELLANEOUS) IMPLANT
BAG PRESSURE INF REUSE 1000 (BAG) IMPLANT
BLADE SURG SZ11 CARB STEEL (BLADE) ×1 IMPLANT
CANNULA REDUCER 12-8 DVNC XI (CANNULA) ×1 IMPLANT
CATH REDDICK CHOLANGI 4FR 50CM (CATHETERS) IMPLANT
CAUTERY HOOK MNPLR 1.6 DVNC XI (INSTRUMENTS) ×1 IMPLANT
CLIP LIGATING HEM O LOK PURPLE (MISCELLANEOUS) IMPLANT
CLIP LIGATING HEMO O LOK GREEN (MISCELLANEOUS) ×1 IMPLANT
DERMABOND ADVANCED .7 DNX12 (GAUZE/BANDAGES/DRESSINGS) ×1 IMPLANT
DRAIN CHANNEL JP 15F RND 16 (MISCELLANEOUS) IMPLANT
DRAPE ARM DVNC X/XI (DISPOSABLE) ×4 IMPLANT
DRAPE C-ARM XRAY 36X54 (DRAPES) IMPLANT
DRAPE COLUMN DVNC XI (DISPOSABLE) ×1 IMPLANT
ELECT REM PT RETURN 9FT ADLT (ELECTROSURGICAL) ×1
ELECTRODE REM PT RTRN 9FT ADLT (ELECTROSURGICAL) ×1 IMPLANT
FORCEPS BPLR 8 MD DVNC XI (FORCEP) ×1 IMPLANT
FORCEPS BPLR R/ABLATION 8 DVNC (INSTRUMENTS) ×1 IMPLANT
FORCEPS PROGRASP DVNC XI (FORCEP) ×1 IMPLANT
GLOVE BIO SURGEON STRL SZ 6.5 (GLOVE) ×2 IMPLANT
GLOVE BIOGEL PI IND STRL 6.5 (GLOVE) ×2 IMPLANT
GOWN STRL REUS W/ TWL LRG LVL3 (GOWN DISPOSABLE) ×3 IMPLANT
GOWN STRL REUS W/TWL LRG LVL3 (GOWN DISPOSABLE) ×3
GRASPER SUT TROCAR 14GX15 (MISCELLANEOUS) ×1 IMPLANT
IRRIGATOR SUCT 8 DISP DVNC XI (IRRIGATION / IRRIGATOR) IMPLANT
IV CATH ANGIO 12GX3 LT BLUE (NEEDLE) IMPLANT
IV NS 1000ML (IV SOLUTION) ×1
IV NS 1000ML BAXH (IV SOLUTION) IMPLANT
KIT PINK PAD W/HEAD ARE REST (MISCELLANEOUS) ×1 IMPLANT
KIT PINK PAD W/HEAD ARM REST (MISCELLANEOUS) ×1 IMPLANT
LABEL OR SOLS (LABEL) ×1 IMPLANT
MANIFOLD NEPTUNE II (INSTRUMENTS) ×1 IMPLANT
NDL HYPO 22X1.5 SAFETY MO (MISCELLANEOUS) ×1 IMPLANT
NDL INSUFFLATION 14GA 120MM (NEEDLE) ×1 IMPLANT
NEEDLE HYPO 22X1.5 SAFETY MO (MISCELLANEOUS) ×1 IMPLANT
NEEDLE INSUFFLATION 14GA 120MM (NEEDLE) ×1 IMPLANT
NS IRRIG 500ML POUR BTL (IV SOLUTION) ×1 IMPLANT
OBTURATOR OPTICAL STND 8 DVNC (TROCAR) ×1
OBTURATOR OPTICALSTD 8 DVNC (TROCAR) ×1 IMPLANT
PACK LAP CHOLECYSTECTOMY (MISCELLANEOUS) ×1 IMPLANT
SEAL UNIV 5-12 XI (MISCELLANEOUS) ×4 IMPLANT
SET TUBE SMOKE EVAC HIGH FLOW (TUBING) ×1 IMPLANT
SOL ELECTROSURG ANTI STICK (MISCELLANEOUS) ×1
SOLUTION ELECTROSURG ANTI STCK (MISCELLANEOUS) ×1 IMPLANT
SPIKE FLUID TRANSFER (MISCELLANEOUS) ×2 IMPLANT
SPONGE DRAIN TRACH 4X4 STRL 2S (GAUZE/BANDAGES/DRESSINGS) IMPLANT
SPONGE T-LAP 4X18 ~~LOC~~+RFID (SPONGE) IMPLANT
SUCT RESERVOIR 100CC (MISCELLANEOUS) IMPLANT
SUT MNCRL 4-0 (SUTURE) ×1
SUT MNCRL 4-0 27XMFL (SUTURE) ×1
SUT PROLENE 3 0 SH DA (SUTURE) IMPLANT
SUT SILK 3 0 SH 30 (SUTURE) IMPLANT
SUT VICRYL 0 UR6 27IN ABS (SUTURE) ×1 IMPLANT
SUTURE MNCRL 4-0 27XMF (SUTURE) ×1 IMPLANT
SYS BAG RETRIEVAL 10MM (BASKET) ×2
SYSTEM BAG RETRIEVAL 10MM (BASKET) ×1 IMPLANT
TRAP FLUID SMOKE EVACUATOR (MISCELLANEOUS) ×1 IMPLANT
WATER STERILE IRR 500ML POUR (IV SOLUTION) ×1 IMPLANT

## 2023-01-29 NOTE — Discharge Instructions (Signed)
Some PCP options in [place] area- not a comprehensive list  [facility]- [phone] [facility]- [phone] [facility]- [phone] [facility]- [phone] [facility]- [phone] [place] [facility]- [phone]  or [facility] [phone]  

## 2023-01-29 NOTE — Anesthesia Postprocedure Evaluation (Signed)
Anesthesia Post Note  Patient: Elizabeth Shelton  Procedure(s) Performed: XI ROBOTIC ASSISTED LAPAROSCOPIC CHOLECYSTECTOMY INTRAOPERATIVE CHOLANGIOGRAM  Patient location during evaluation: PACU Anesthesia Type: General Level of consciousness: awake and alert, oriented and patient cooperative Pain management: pain level controlled Vital Signs Assessment: post-procedure vital signs reviewed and stable Respiratory status: spontaneous breathing, nonlabored ventilation and respiratory function stable Cardiovascular status: blood pressure returned to baseline and stable Postop Assessment: adequate PO intake Anesthetic complications: no   No notable events documented.   Last Vitals:  Vitals:   01/29/23 1348 01/29/23 1355  BP:  106/74  Pulse:    Resp:  20  Temp: 36.7 C   SpO2:  98%    Last Pain:  Vitals:   01/29/23 1400  TempSrc:   PainSc: 5                  Reed Breech

## 2023-01-29 NOTE — Plan of Care (Signed)

## 2023-01-29 NOTE — Progress Notes (Signed)
Patient s/p Robotic Assisted Laparoscopic Cholecystectomy with JP drain placement  to RLQ.  A+Ox4. VSS. Due to void. Husband supporting at bedside. Will continue to monitor and assess with plan of care.

## 2023-01-29 NOTE — ED Triage Notes (Signed)
L sided abd pain and flank pain. Reports 8-10 episodes in the last 8 weeks. Reports radiation into L chest at times. Reports nausea w/o emesis. Denies diarrhea. Pt ambulatory to triage. Alert and oriented following commands. Breathing unlabored speaking in full sentences.

## 2023-01-29 NOTE — Anesthesia Procedure Notes (Signed)
Procedure Name: Intubation Date/Time: 01/29/2023 10:29 AM  Performed by: Omer Jack, CRNAPre-anesthesia Checklist: Patient identified, Patient being monitored, Timeout performed, Emergency Drugs available and Suction available Patient Re-evaluated:Patient Re-evaluated prior to induction Oxygen Delivery Method: Circle system utilized Preoxygenation: Pre-oxygenation with 100% oxygen Induction Type: IV induction Ventilation: Mask ventilation without difficulty Laryngoscope Size: 3 and McGraph Grade View: Grade I Tube type: Oral Tube size: 7.0 mm Number of attempts: 1 Airway Equipment and Method: Stylet Placement Confirmation: ETT inserted through vocal cords under direct vision, positive ETCO2 and breath sounds checked- equal and bilateral Secured at: 21 cm Tube secured with: Tape Dental Injury: Teeth and Oropharynx as per pre-operative assessment

## 2023-01-29 NOTE — Anesthesia Preprocedure Evaluation (Addendum)
Anesthesia Evaluation  Patient identified by MRN, date of birth, ID band Patient awake    Reviewed: Allergy & Precautions, NPO status , Patient's Chart, lab work & pertinent test results  History of Anesthesia Complications (+) PONV and history of anesthetic complications  Airway Mallampati: I   Neck ROM: Full    Dental  (+) Missing   Pulmonary neg pulmonary ROS   Pulmonary exam normal breath sounds clear to auscultation       Cardiovascular Exercise Tolerance: Good Normal cardiovascular exam Rhythm:Regular Rate:Normal  Tachycardia, PACs  ECG 01/29/23:  Normal sinus rhythm Cannot rule out Anterior infarct (cited on or before 02-Nov-2019)  Echo 01/23/23: Negative dobutamine stress echo for ischemia.   Neuro/Psych   Anxiety     negative neurological ROS     GI/Hepatic ,GERD  ,,  Endo/Other  negative endocrine ROS    Renal/GU negative Renal ROS     Musculoskeletal   Abdominal   Peds  Hematology  (+) Blood dyscrasia, anemia   Anesthesia Other Findings Cardiology note 09/16/22:  46 y.o. female with 1. Tachycardia 2. PAC (premature atrial contraction) 3. Chest pain at rest 4. Gastroesophageal reflux disease without esophagitis  Plan  Tachycardia, unclear etiology, stable. Heart rate today is 77 bpm, consider beta-blocker or calcium blocker PACs by history, consider low-dose beta-blocker therapy to help with symptom control Atypical chest pain, most likely not cardiac related, previous stress echo 04/13/2021 was unremarkable. Patient reports previous episode resolved with Tums and position change. Continue current management. GERD by history, recommend omeprazole 40 mg once daily Have the patient follow-up in 1 year  Return in about 1 year (around 09/17/2023).    Reproductive/Obstetrics                             Anesthesia Physical Anesthesia Plan  ASA: 2 and emergent  Anesthesia  Plan: General   Post-op Pain Management:    Induction: Intravenous  PONV Risk Score and Plan: 4 or greater and Ondansetron, Dexamethasone, Treatment may vary due to age or medical condition, Scopolamine patch - Pre-op, Propofol infusion and TIVA  Airway Management Planned: Oral ETT  Additional Equipment:   Intra-op Plan:   Post-operative Plan: Extubation in OR  Informed Consent: I have reviewed the patients History and Physical, chart, labs and discussed the procedure including the risks, benefits and alternatives for the proposed anesthesia with the patient or authorized representative who has indicated his/her understanding and acceptance.     Dental advisory given  Plan Discussed with: CRNA  Anesthesia Plan Comments: (Patient consented for risks of anesthesia including but not limited to:  - adverse reactions to medications - damage to eyes, teeth, lips or other oral mucosa - nerve damage due to positioning  - sore throat or hoarseness - damage to heart, brain, nerves, lungs, other parts of body or loss of life  Informed patient about role of CRNA in peri- and intra-operative care.  Patient voiced understanding.)        Anesthesia Quick Evaluation

## 2023-01-29 NOTE — Op Note (Signed)
Preoperative diagnosis: Acute cholecystitis  Postoperative diagnosis: Acute cholecystitis  Procedure: Robotic Assisted Laparoscopic subtotal cholecystectomy with intraoperative cholangiogram.   Anesthesia: GETA   Surgeon: Dr. Hazle Quant  Wound Classification: Clean Contaminated  Indications: Patient is a 47 y.o. female developed right upper quadrant pain, nausea and on workup was found to have cholelithiasis with a dilated common duct. Robotic Assisted Laparoscopic cholecystectomy with cholangiogram was elected for treatment of cholecystitis and rule out choledocholithiasis.  Findings: Severe inflammation of the gallbladder Critical view of safety unable to be achieved Stone removed from the cystic duct Cholangiogram negative for filling defect in the common bile duct Adequate hemostasis  Description of procedure: The patient was placed on the operating table in the supine position. General anesthesia was induced. A time-out was completed verifying correct patient, procedure, site, positioning, and implant(s) and/or special equipment prior to beginning this procedure. An orogastric tube was placed. The abdomen was prepped and draped in the usual sterile fashion.  An incision was made in a natural skin line below the umbilicus.  The fascia was elevated and the Veress needle inserted. Proper position was confirmed by aspiration and saline meniscus test.  The abdomen was insufflated with carbon dioxide to a pressure of 15 mmHg. The patient tolerated insufflation well. A 8-mm trocar was then inserted in optiview fashion.  The laparoscope was inserted and the abdomen inspected. No injuries from initial trocar placement were noted. Additional trocars were then inserted in the following locations: an 8-mm trocar in the left lateral abdomen, and another two 8-mm trocars to the right side of the abdomen 5 cm appart. The umbilical trocar was changed to a 12 mm trocar all under direct visualization.  The abdomen was inspected and no abnormalities were found. The table was placed in the reverse Trendelenburg position with the right side up. The robotic arms were docked and target anatomy identified. Instrument inserted under direct visualization.  Filmy adhesions between the gallbladder and omentum, duodenum and transverse colon were lysed with electrocautery.  The gallbladder was so thick that it was unable to be grasped with the ProGrasp.  A small incision in zone of the gallbladder was done and the gallbladder was aspirated from the dome.  The dome of the gallbladder was then grasped with a prograsp and retracted over the dome of the liver.  Very severe inflammation tissue was identified.  The common bile duct was seen very close to what looks to be the infundibulum.  It was decided to proceed with opening the gallbladder at the mid body.  The gallbladder was opened horizontally in the mid the bladder body and the stones were removed.  Careful dissection of the gallbladder was done anteriorly until the infundibulum was identified.  Very difficult and time-consuming dissection of the infundibular area was done but I was unable to encircle the cystic duct.  I was able to identify the cystic duct from the inside the gallbladder.  1 stone was removed from the cystic duct with the [place].  New bile with ICG green was able to be identified coming out from the cystic duct.  I inserted a cholangiogram catheter through the cystic duct and intraoperative cholangiogram was done.  The common bile duct looked dilated but there was no filling defect.  No sign of duct injury.  About 90% of the gallbladder was able to be removed leaving just a small amount of infundibulum right at the ostium of the cystic duct.  This area was able to be closed with  a pursestring with 3-0 silk. The rest of the gallbladder was then dissected from its peritoneal attachments by electrocautery. Hemostasis was checked and the gallbladder and  contained stones were removed using an endoscopic retrieval bag. The gallbladder fossa was copiously irrigated with saline and hemostasis was obtained. There was no evidence of bleeding from the gallbladder fossa or cystic artery or leakage of the bile from the cystic duct stump.  15 French drain was left in the right upper quadrant.  Vistaseal was irrigated in the capillary bed.  Secondary trocars were removed under direct vision. No bleeding was noted. The robotic arms were undoked. The scope was withdrawn and the umbilical trocar removed. The abdomen was allowed to collapse. The fascia of the 12mm trocar sites was closed with figure-of-eight 0 vicryl sutures. The skin was closed with subcuticular sutures of 4-0 monocryl and topical skin adhesive. The orogastric tube was removed.  The patient tolerated the procedure well and was taken to the postanesthesia care unit in stable condition.   Specimen: Gallbladder  Complications: None  EBL: 25 mL

## 2023-01-29 NOTE — Progress Notes (Signed)
Patient voiding.

## 2023-01-29 NOTE — Transfer of Care (Signed)
Immediate Anesthesia Transfer of Care Note  Patient: Elizabeth Shelton  Procedure(s) Performed: XI ROBOTIC ASSISTED LAPAROSCOPIC CHOLECYSTECTOMY INTRAOPERATIVE CHOLANGIOGRAM  Patient Location: PACU  Anesthesia Type:General  Level of Consciousness: drowsy and patient cooperative  Airway & Oxygen Therapy: Patient Spontanous Breathing and Patient connected to face mask oxygen  Post-op Assessment: Report given to RN and Post -op Vital signs reviewed and stable  Post vital signs: Reviewed and stable  Last Vitals:  Vitals Value Taken Time  BP 106/74 01/29/23 1355  Temp 36.7 C 01/29/23 1348  Pulse 80 01/29/23 1355  Resp 30 01/29/23 1355  SpO2 97 % 01/29/23 1355  Vitals shown include unvalidated device data.  Last Pain:  Vitals:   01/29/23 1348  TempSrc:   PainSc: 0-No pain         Complications: No notable events documented.

## 2023-01-29 NOTE — ED Provider Notes (Addendum)
[facility] Provider Note    Event Date/Time   First MD Initiated Contact with Patient 01/29/23 0134     (approximate)   History   Abdominal Pain   HPI  Elizabeth Shelton is a 47 y.o. female past medical history significant for GERD, SVT, who presents to the emergency department with abdominal pain.  Patient endorses left-sided flank and abdominal pain that has been ongoing for the past 6 to 8 weeks.  Currently having a sharp stabbing pain to the left side, states that it has been intermittent over the past 6 to 8 weeks.  Evaluated recently at outside hospital when she was on vacation, worked up for chest pain.  Had a CTA that showed no signs of pulmonary embolism.  Stress testing that was negative.  Given information to follow-up with her primary care provider and cardiologist.  Followed by Dr. Juliann Pares with cardiology.  Denies any dysuria, urinary urgency or frequency.  Endorses nausea but no episodes of vomiting.  Does have a history of IBS but states that this feels different than her normal IBS.  Prior hysterectomy and left ovarian removal.  History of endometriosis.  No prior appendectomy.  No prior CT scan of her abdomen and pelvis.     Physical Exam   Triage Vital Signs: ED Triage Vitals  Enc Vitals Group     BP 01/29/23 0103 136/76     Pulse Rate 01/29/23 0103 84     Resp 01/29/23 0103 20     Temp 01/29/23 0103 98.3 F (36.8 C)     Temp Source 01/29/23 0103 Oral     SpO2 01/29/23 0103 100 %     Weight 01/29/23 0102 161 lb (73 kg)     Height 01/29/23 0102 5\' 5"  (1.651 m)     Head Circumference --      Peak Flow --      Pain Score 01/29/23 0102 10     Pain Loc --      Pain Edu? --      Excl. in GC? --     Most recent vital signs: Vitals:   01/29/23 0103 01/29/23 0515  BP: 136/76   Pulse: 84   Resp: 20   Temp: 98.3 F (36.8 C) 98.2 F (36.8 C)  SpO2: 100%     Physical Exam Constitutional:      Appearance: She is well-developed.   HENT:     Head: Atraumatic.  Eyes:     Conjunctiva/sclera: Conjunctivae normal.  Cardiovascular:     Rate and Rhythm: Regular rhythm.  Pulmonary:     Effort: No respiratory distress.  Abdominal:     General: There is no distension.     Tenderness: There is abdominal tenderness in the left upper quadrant. There is left CVA tenderness.  Musculoskeletal:        General: Normal range of motion.     Cervical back: Normal range of motion.  Skin:    General: Skin is warm.  Neurological:     Mental Status: She is alert. Mental status is at baseline.     IMPRESSION / MDM / ASSESSMENT AND PLAN / ED COURSE  I reviewed the triage vital signs and the nursing notes.  On chart review able to review her outside records from her recent hospitalization,'s had a CTA that showed no signs of pulmonary embolism, no signs of pneumonia.  Worked up for cardiac etiology and was negative.  Differential diagnosis including kidney stone,  pyelonephritis, musculoskeletal, GERD, gastritis, diverticulitis, bowel obstruction  RADIOLOGY I independently reviewed imaging, my interpretation of imaging: CT abdomen and pelvis with contrast -significant gallbladder wall thickening.  Read as acute cholecystitis with dilated common bile duct  Ultrasound with findings of acute cholecystitis with common bile duct dilation of 7.5 mm.  LABS (all labs ordered are listed, but only abnormal results are displayed) Labs interpreted as -    Labs Reviewed  COMPREHENSIVE METABOLIC PANEL - Abnormal; Notable for the following components:      Result Value   Glucose, Bld 140 (*)    All other components within normal limits  URINALYSIS, W/ REFLEX TO CULTURE (INFECTION SUSPECTED) - Abnormal; Notable for the following components:   Color, Urine STRAW (*)    APPearance CLEAR (*)    Bacteria, UA MANY (*)    All other components within normal limits  CULTURE, BLOOD (ROUTINE X 2)  CULTURE, BLOOD (ROUTINE X 2)  LIPASE, BLOOD   CBC  LACTIC ACID, PLASMA  LACTIC ACID, PLASMA  TROPONIN I (HIGH SENSITIVITY)  TROPONIN I (HIGH SENSITIVITY)     MDM    UA without signs of urinary tract infection.  Lipase normal.  No leukocytosis.  Creatinine at baseline with no significant electrolyte abnormalities.  Normal LFTs and lipase.  Troponin negative, have a low suspicion for ACS.  CT scan with concern for acute cholecystitis and dilated common bile duct.  No findings to explain the patient's left-sided abdominal pain.  Repeat exam patient does have tenderness to the right upper quadrant but continues to complain of her significant pain on the left side.  Plan for ultrasound, if has dilated common bile duct on ultrasound may need MRCP.  Normal LFTs and T. bili.  Adding on blood cultures and lactic acid.  On my evaluation of ultrasound concern for acute cholecystitis.  Common bile duct that is dilated.  Gallstones present.  Added on blood cultures, lactic acid normal started on antibiotics with ceftriaxone and Flagyl given her allergies.  States that she is only had hives with penicillins.  Ultrasound with findings of acute cholecystitis.  Consulted general surgery and discussed with Dr. Maia Plan who recommended evaluation and likely admission.  PROCEDURES:  Critical Care performed: No  Ultrasound ED Peripheral IV (Provider)  Date/Time: 01/29/2023 4:28 AM  Performed by: Corena Herter, MD Authorized by: Corena Herter, MD   Procedure details:    Indications: multiple failed IV attempts     Skin Prep: chlorhexidine gluconate     Location:  Right AC   Angiocath:  20 G   Bedside Ultrasound Guided: Yes     Images: not archived     Patient tolerated procedure without complications: Yes     Dressing applied: Yes     Patient's presentation is most consistent with acute presentation with potential threat to life or bodily function.   MEDICATIONS ORDERED IN ED: Medications  cefTRIAXone (ROCEPHIN) 2 g in sodium chloride  0.9 % 100 mL IVPB (has no administration in time range)  metroNIDAZOLE (FLAGYL) IVPB 500 mg (has no administration in time range)  iohexol (OMNIPAQUE) 300 MG/ML solution 100 mL (100 mLs Intravenous Contrast Given 01/29/23 0415)  pantoprazole (PROTONIX) injection 40 mg (40 mg Intravenous Given 01/29/23 0445)  fentaNYL (SUBLIMAZE) injection 50 mcg (50 mcg Intravenous Given 01/29/23 0445)  ondansetron (ZOFRAN) injection 4 mg (4 mg Intravenous Given 01/29/23 0514)  fentaNYL (SUBLIMAZE) injection 50 mcg (50 mcg Intravenous Given 01/29/23 0603)    FINAL CLINICAL IMPRESSION(S) / ED DIAGNOSES  Final diagnoses:  Acute cholecystitis     Rx / DC Orders   ED Discharge Orders     None        Note:  This document was prepared using Dragon voice recognition software and may include unintentional dictation errors.   Corena Herter, MD 01/29/23 1610    Corena Herter, MD 01/29/23 9604

## 2023-01-29 NOTE — H&P (Signed)
SURGICAL CONSULTATION NOTE   HISTORY OF PRESENT ILLNESS (HPI):  47 y.o. female presented to [facility] ED for evaluation of abdominal pain. Patient reports she has been having left-sided abdominal pain for the last 6 to 8 weeks.  This week the pain was more epigastric.  Patient cannot identify any alleviating or aggravating factors.  She went to the ED last week when she was on vacation due to chest pain and she had a cardiac workup which was negative for cardiac pathology.  As per patient reported due to elevated white blood cell count she was started on antibiotic therapy because she was told that she has an infection somewhere in her body.  She has been taking Levaquin.  Here at the ED she was found with normal white blood cell count.  There was tenderness of palpation in the right upper quadrant.  She had a CT scan of the abdomen and pelvis that shows gallbladder wall thickening with cholelithiasis.  Common bile duct was dilated to 12 mm.  The was followed by abdominal ultrasound that shows gallbladder wall thickening and cholelithiasis with common bile duct of 7 mm.  I personally evaluated the images.  Bilirubin within normal limits.  Surgery is consulted by Dr. Ivette Loyal in this context for evaluation and management of acute cholecystitis.  PAST MEDICAL HISTORY (PMH):  Past Medical History:  Diagnosis Date   Anemia    Complication of anesthesia    Family history of adverse reaction to anesthesia    DAD AND PATERNAL GRANDFATHER N/V   GERD (gastroesophageal reflux disease)    Heart palpitations    PONV (postoperative nausea and vomiting)    Tachycardia    PT HAS HAD EKG'S AND WORN HOLTER 48 HOUR WHICH WERE ALL NORMAL     PAST SURGICAL HISTORY (PSH):  Past Surgical History:  Procedure Laterality Date   ABDOMINAL HYSTERECTOMY     CYSTOSCOPY N/A 01/29/2015   Procedure: CYSTOSCOPY;  Surgeon: Nadara Mustard, MD;  Location: [facility] ORS;  Service: Gynecology;  Laterality: N/A;   LAPAROSCOPIC BILATERAL  SALPINGECTOMY Bilateral 01/29/2015   Procedure: LAPAROSCOPIC BILATERAL SALPINGECTOMY;  Surgeon: Nadara Mustard, MD;  Location: [facility] ORS;  Service: Gynecology;  Laterality: Bilateral;   LAPAROSCOPIC HYSTERECTOMY N/A 01/29/2015   Procedure: HYSTERECTOMY TOTAL LAPAROSCOPIC;  Surgeon: Nadara Mustard, MD;  Location: [facility] ORS;  Service: Gynecology;  Laterality: N/A;   LAPAROSCOPIC LYSIS OF ADHESIONS  02/22/2018   Procedure: LAPAROSCOPIC LYSIS OF ADHESIONS;  Surgeon: Nadara Mustard, MD;  Location: [facility] ORS;  Service: Gynecology;;   OOPHORECTOMY       MEDICATIONS:  Prior to Admission medications   Medication Sig Start Date End Date Taking? Authorizing Provider  famotidine (PEPCID) 20 MG tablet Take 1 tablet (20 mg total) by mouth 2 (two) times daily. 11/02/19 11/01/20  Triplett, Kasandra Knudsen, FNP  traMADol (ULTRAM) 50 MG tablet Take 1 tablet (50 mg total) by mouth every 6 (six) hours as needed. 11/02/19   Triplett, Rulon Eisenmenger B, FNP     ALLERGIES:  Allergies  Allergen Reactions   Amoxicillin Hives    Has patient had a PCN reaction causing immediate rash, facial/tongue/throat swelling, SOB or lightheadedness with hypotension: No Has patient had a PCN reaction causing severe rash involving mucus membranes or skin necrosis: No Has patient had a PCN reaction that required hospitalization: No Has patient had a PCN reaction occurring within the last 10 years: Yes If all of the above answers are "NO", then may proceed with Cephalosporin use.  Aspirin Hives   Codeine Hives        Sulfa Antibiotics Other (See Comments)    Joint pain  Intense joint pain Severe joint pain   Adhesive [Tape] Other (See Comments)    Blisters skin and takes off top layer of skin when tape is removed-PAPER TAPE OK TO USE   Penicillins Hives    Has patient had a PCN reaction causing immediate rash, facial/tongue/throat swelling, SOB or lightheadedness with hypotension: No Has patient had a PCN reaction causing severe rash involving  mucus membranes or skin necrosis: No Has patient had a PCN reaction that required hospitalization: No Has patient had a PCN reaction occurring within the last 10 years: Yes If all of the above answers are "NO", then may proceed with Cephalosporin use.      SOCIAL HISTORY:  Social History   Socioeconomic History   Marital status: Married    Spouse name: Not on file   Number of children: Not on file   Years of education: Not on file   Highest education level: Not on file  Occupational History   Not on file  Tobacco Use   Smoking status: Never   Smokeless tobacco: Never  Vaping Use   Vaping Use: Never used  Substance and Sexual Activity   Alcohol use: No   Drug use: No   Sexual activity: Yes  Other Topics Concern   Not on file  Social History Narrative   Not on file   Social Determinants of Health   Financial Resource Strain: Not on file  Food Insecurity: Not on file  Transportation Needs: Not on file  Physical Activity: Not on file  Stress: Not on file  Social Connections: Not on file  Intimate Partner Violence: Not on file     FAMILY HISTORY:  Family History  Problem Relation Age of Onset   Hypercholesterolemia Mother 37   Irritable bowel syndrome Sister      REVIEW OF SYSTEMS:  Constitutional: denies weight loss, fever, chills, or sweats  Eyes: denies any other vision changes, history of eye injury  ENT: denies sore throat, hearing problems  Respiratory: denies shortness of breath, wheezing  Cardiovascular: denies chest pain, palpitations  Gastrointestinal: positive abdominal pain, nausea and vomitnig Genitourinary: denies burning with urination or urinary frequency Musculoskeletal: denies any other joint pains or cramps  Skin: denies any other rashes or skin discolorations  Neurological: denies any other headache, dizziness, weakness  Psychiatric: denies any other depression, anxiety   All other review of systems were negative   VITAL SIGNS:  Temp:   [98.2 F (36.8 C)-98.9 F (37.2 C)] 98.9 F (37.2 C) (07/07 0739) Pulse Rate:  [80-84] 80 (07/07 0739) Resp:  [18-20] 18 (07/07 0739) BP: (130-136)/(76-80) 130/80 (07/07 0739) SpO2:  [100 %] 100 % (07/07 0739) Weight:  [73 kg] 73 kg (07/07 0102)     Height: 5\' 5"  (165.1 cm) Weight: 73 kg BMI (Calculated): 26.79   INTAKE/OUTPUT:  This shift: No intake/output data recorded.  Last 2 shifts: @IOLAST2SHIFTS @   PHYSICAL EXAM:  Constitutional:  -- Normal body habitus  -- Awake, alert, and oriented x3  Eyes:  -- Pupils equally round and reactive to light  -- No scleral icterus  Ear, nose, and throat:  -- No jugular venous distension  Pulmonary:  -- No crackles  -- Equal breath sounds bilaterally -- Breathing non-labored at rest Cardiovascular:  -- S1, S2 present  -- No pericardial rubs Gastrointestinal:  -- Abdomen soft, tender to  palpation in the right upper quadrant, non-distended, no guarding or rebound tenderness -- No abdominal masses appreciated, pulsatile or otherwise  Musculoskeletal and Integumentary:  -- Wounds: None appreciated -- Extremities: B/L UE and LE FROM, hands and feet warm, no edema  Neurologic:  -- Motor function: intact and symmetric -- Sensation: intact and symmetric   Labs:     Latest Ref Rng & Units 01/29/2023    1:05 AM 11/02/2019   12:37 PM 02/21/2018    8:56 AM  CBC  WBC 4.0 - 10.5 K/uL 10.4  7.7  5.3   Hemoglobin 12.0 - 15.0 g/dL 16.1  09.6  04.5   Hematocrit 36.0 - 46.0 % 38.8  40.6  38.7   Platelets 150 - 400 K/uL 349  366  297       Latest Ref Rng & Units 01/29/2023    1:05 AM 11/02/2019   12:37 PM 03/03/2016    2:27 PM  CMP  Glucose 70 - 99 mg/dL 409  811    BUN 6 - 20 mg/dL 18  7    Creatinine 9.14 - 1.00 mg/dL 7.82  9.56    Sodium 213 - 145 mmol/L 139  138    Potassium 3.5 - 5.1 mmol/L 4.1  3.5    Chloride 98 - 111 mmol/L 108  106    CO2 22 - 32 mmol/L 22  23    Calcium 8.9 - 10.3 mg/dL 9.0  9.1    Total Protein 6.5 - 8.1 g/dL 7.5    6.7   Total Bilirubin 0.3 - 1.2 mg/dL 0.3   0.3   Alkaline Phos 38 - 126 U/L 73   75   AST 15 - 41 U/L 22   11   ALT 0 - 44 U/L 25   8     Imaging studies:  EXAM: ULTRASOUND ABDOMEN LIMITED RIGHT UPPER QUADRANT   COMPARISON:  CT AP 01/29/2023   FINDINGS: Gallbladder:   Gallstones are noted measuring up to 1.4 cm. Gallbladder sludge. The gallbladder wall is thickened measuring 6.2 mm. No pericholecystic fluid. Negative sonographic Murphy's sign reported, however patient was given fentanyl prior to exam.   Common bile duct:   Diameter: 7.5 mm.  No intrahepatic bile duct dilatation.   Liver:   No focal lesion identified. Within normal limits in parenchymal echogenicity. Portal vein is patent on color Doppler imaging with normal direction of blood flow towards the liver.   Other: None.   IMPRESSION: 1. Cholelithiasis with gallbladder wall thickening. Findings are concerning for acute cholecystitis. 2. Mildly dilated common bile duct measuring 7.5 mm.     Electronically Signed   By: Signa Kell M.D.   On: 01/29/2023 07:00  Assessment/Plan:  47 y.o. female with acute cholecystitis.  Patient with history, physical exam and images consistent with acute cholecystitis. Patient oriented about diagnosis and surgical management as treatment.   I discussed with the patient that due to dilated common bile duct I will consider doing an intraoperative cholangiogram even though her bilirubin is within normal limits.  If the amount of inflammation and scar tissue on the cystic duct area may be very difficult and unsafe the cholangiogram this can be aborted due to adequate liver enzymes and bilirubin.  Discussed the risk of surgery including post-op infxn, seroma, biloma, chronic pain, poor-delayed wound healing, retained gallstone, conversion to open procedure, post-op SBO or ileus, and need for additional procedures to address said risks.  The risks of general anesthetic  including  MI, CVA, sudden death or even reaction to anesthetic medications also discussed. Alternatives include continued observation.  Benefits include possible symptom relief, prevention of complications including acute cholecystitis, pancreatitis.  Gae Gallop, MD

## 2023-01-29 NOTE — TOC CM/SW Note (Signed)
Transition of Care [facility]) - Inpatient Brief Assessment   Patient Details  Name: JOVONA NEAL MRN: [id number] Date of Birth: 1976-04-14  [facility] of [facility]) CM/SW Contact:    Chapman Fitch, RN Phone Number: 01/29/2023, 4:00 PM   Clinical Narrative:  No PCP on file.  List of local PCP added to AVS  Transition of Care Asessment: Insurance and Status: Insurance coverage has been reviewed Patient has primary care physician: No     Prior/Current Home Services: No current home services Social Determinants of Health Reivew: SDOH reviewed no interventions necessary Readmission risk has been reviewed: Yes Transition of care needs: no transition of care needs at this time

## 2023-01-30 ENCOUNTER — Encounter: Payer: Self-pay | Admitting: General Surgery

## 2023-01-30 LAB — BASIC METABOLIC PANEL
Anion gap: 9 (ref 5–15)
BUN: 9 mg/dL (ref 6–20)
CO2: 21 mmol/L — ABNORMAL LOW (ref 22–32)
Calcium: 8.4 mg/dL — ABNORMAL LOW (ref 8.9–10.3)
Chloride: 104 mmol/L (ref 98–111)
Creatinine, Ser: 0.63 mg/dL (ref 0.44–1.00)
GFR, Estimated: >60 mL/min (ref >60)
Glucose, Bld: 164 mg/dL — ABNORMAL HIGH (ref 70–99)
Potassium: 3.4 mmol/L — ABNORMAL LOW (ref 3.5–5.1)
Sodium: 134 mmol/L — ABNORMAL LOW (ref 135–145)

## 2023-01-30 LAB — CBC
HCT: 35.1 % — ABNORMAL LOW (ref 36.0–46.0)
Hemoglobin: 12 g/dL (ref 12.0–15.0)
MCH: 30.2 pg (ref 26.0–34.0)
MCHC: 34.2 g/dL (ref 30.0–36.0)
MCV: 88.4 fL (ref 80.0–100.0)
Platelets: 316 10*3/uL (ref 150–400)
RBC: 3.97 MIL/uL (ref 3.87–5.11)
RDW: 11.8 % (ref 11.5–15.5)
WBC: 13 10*3/uL — ABNORMAL HIGH (ref 4.0–10.5)
nRBC: 0 % (ref 0.0–0.2)

## 2023-01-30 LAB — CULTURE, BLOOD (ROUTINE X 2)

## 2023-01-30 MED ORDER — LORATADINE 10 MG PO TABS
10.0000 mg | ORAL_TABLET | Freq: Every day | ORAL | Status: DC
  Administered 2023-01-30 – 2023-01-31 (×2): 10 mg via ORAL
  Filled 2023-01-30 (×2): qty 1

## 2023-01-30 MED ORDER — METOPROLOL SUCCINATE ER 25 MG PO TB24
25.0000 mg | ORAL_TABLET | Freq: Every day | ORAL | Status: DC
  Administered 2023-01-30 – 2023-01-31 (×2): 25 mg via ORAL
  Filled 2023-01-30 (×2): qty 1

## 2023-01-30 NOTE — Progress Notes (Signed)
Patient ID: Elizabeth Shelton, female   DOB: [birth year], 47 y.o.   MRN: [id number]     SURGICAL PROGRESS NOTE   Hospital Day(s): 0.   Interval History: Patient seen and examined, no acute events or new complaints overnight. Patient reports feeling better than yesterday.  She still has significant soreness on the upper abdomen.  She has been able to ambulate adequately yet.  She has not been able to have a complete meal.  Drain with 40 mL of serosanguineous fluid.  Vital signs in last 24 hours: [min-max] current  Temp:  [97.2 F (36.2 C)-98.6 F (37 C)] 98.6 F (37 C) (07/08 0715) Pulse Rate:  [60-91] 79 (07/08 0715) Resp:  [11-20] 18 (07/08 0715) BP: (106-126)/(61-76) 120/76 (07/08 0715) SpO2:  [95 %-99 %] 98 % (07/08 0715)     Height: 5\' 5"  (165.1 cm) Weight: 73 kg BMI (Calculated): 26.79   Physical Exam:  Constitutional: alert, cooperative and no distress  Respiratory: breathing non-labored at rest  Cardiovascular: regular rate and sinus rhythm  Gastrointestinal: soft, non-tender, and non-distended  Labs:     Latest Ref Rng & Units 01/30/2023    6:01 AM 01/29/2023    1:05 AM 11/02/2019   12:37 PM  CBC  WBC 4.0 - 10.5 K/uL 13.0  10.4  7.7   Hemoglobin 12.0 - 15.0 g/dL 40.9  81.1  91.4   Hematocrit 36.0 - 46.0 % 35.1  38.8  40.6   Platelets 150 - 400 K/uL 316  349  366       Latest Ref Rng & Units 01/30/2023    6:01 AM 01/29/2023    1:05 AM 11/02/2019   12:37 PM  CMP  Glucose 70 - 99 mg/dL 782  956  213   BUN 6 - 20 mg/dL 9  18  7    Creatinine 0.44 - 1.00 mg/dL 0.86  5.78  4.69   Sodium 135 - 145 mmol/L 134  139  138   Potassium 3.5 - 5.1 mmol/L 3.4  4.1  3.5   Chloride 98 - 111 mmol/L 104  108  106   CO2 22 - 32 mmol/L 21  22  23    Calcium 8.9 - 10.3 mg/dL 8.4  9.0  9.1   Total Protein 6.5 - 8.1 g/dL  7.5    Total Bilirubin 0.3 - 1.2 mg/dL  0.3    Alkaline Phos 38 - 126 U/L  73    AST 15 - 41 U/L  22    ALT 0 - 44 U/L  25      Imaging studies: No new pertinent imaging  studies   Assessment/Plan:  47 y.o. female with acute cholecystitis 1 Day Post-Op s/p robotic subtotal cholecystectomy.  -Status post very difficult subtotal cholecystectomy. -Drain placed to rule out bile leak.  No sign of bile leak today.  Will continue monitoring drain output -Possible HIDA scan tomorrow to rule out bile leak before removing the drain -Continue pain management -Assess for diet toleration  Gae Gallop, MD

## 2023-01-31 ENCOUNTER — Observation Stay: Payer: BC Managed Care – PPO

## 2023-01-31 DIAGNOSIS — R932 Abnormal findings on diagnostic imaging of liver and biliary tract: Secondary | ICD-10-CM | POA: Diagnosis not present

## 2023-01-31 DIAGNOSIS — Z9889 Other specified postprocedural states: Secondary | ICD-10-CM | POA: Diagnosis not present

## 2023-01-31 DIAGNOSIS — R109 Unspecified abdominal pain: Secondary | ICD-10-CM | POA: Diagnosis not present

## 2023-01-31 DIAGNOSIS — Z9049 Acquired absence of other specified parts of digestive tract: Secondary | ICD-10-CM | POA: Diagnosis not present

## 2023-01-31 LAB — BLOOD CULTURE ID PANEL (REFLEXED) - BCID2

## 2023-01-31 LAB — CBC
HCT: 33.7 % — ABNORMAL LOW (ref 36.0–46.0)
Hemoglobin: 11.4 g/dL — ABNORMAL LOW (ref 12.0–15.0)
MCH: 30.3 pg (ref 26.0–34.0)
MCHC: 33.8 g/dL (ref 30.0–36.0)
MCV: 89.6 fL (ref 80.0–100.0)
Platelets: 312 10*3/uL (ref 150–400)
RBC: 3.76 MIL/uL — ABNORMAL LOW (ref 3.87–5.11)
RDW: 12 % (ref 11.5–15.5)
WBC: 8.4 10*3/uL (ref 4.0–10.5)
nRBC: 0 % (ref 0.0–0.2)

## 2023-01-31 LAB — BASIC METABOLIC PANEL
Anion gap: 8 (ref 5–15)
BUN: 12 mg/dL (ref 6–20)
CO2: 24 mmol/L (ref 22–32)
Calcium: 8.2 mg/dL — ABNORMAL LOW (ref 8.9–10.3)
Chloride: 104 mmol/L (ref 98–111)
Creatinine, Ser: 0.56 mg/dL (ref 0.44–1.00)
GFR, Estimated: >60 mL/min (ref >60)
Glucose, Bld: 91 mg/dL (ref 70–99)
Potassium: 3.4 mmol/L — ABNORMAL LOW (ref 3.5–5.1)
Sodium: 136 mmol/L (ref 135–145)

## 2023-01-31 LAB — CULTURE, BLOOD (ROUTINE X 2): Special Requests: ADEQUATE

## 2023-01-31 MED ORDER — HYDROCODONE-ACETAMINOPHEN 5-325 MG PO TABS: 1.0000 | ORAL_TABLET | ORAL | 0 refills | Status: AC | PRN

## 2023-01-31 MED ORDER — TECHNETIUM TC 99M MEBROFENIN IV KIT
5.0400 | PACK | Freq: Once | INTRAVENOUS | Status: AC | PRN
  Administered 2023-01-31: 5.04 via INTRAVENOUS

## 2023-01-31 NOTE — Progress Notes (Signed)
PHARMACY - PHYSICIAN COMMUNICATION CRITICAL VALUE ALERT - BLOOD CULTURE IDENTIFICATION (BCID)  Elizabeth Shelton is an 47 y.o. female who presented to [facility] on 01/29/2023 with a chief complaint of acute cholecystitis   Assessment:  1 of 4 GPC. BCID: Staph epi MecA resistance detected    Name of physician Contacted: Cintron, MD  Current antibiotics: none  Changes to prescribed antibiotics recommended:  Response not received from provider;  no need for antibiotics as this is likely a contaminant  Results for orders placed or performed during the hospital encounter of 01/29/23  Blood Culture ID Panel (Reflexed) (Collected: 01/29/2023  7:10 AM)  Result Value Ref Range   Enterococcus faecalis NOT DETECTED NOT DETECTED   Enterococcus Faecium NOT DETECTED NOT DETECTED   Listeria monocytogenes NOT DETECTED NOT DETECTED   Staphylococcus species DETECTED (A) NOT DETECTED   Staphylococcus aureus (BCID) NOT DETECTED NOT DETECTED   Staphylococcus epidermidis DETECTED (A) NOT DETECTED   Staphylococcus lugdunensis NOT DETECTED NOT DETECTED   Streptococcus species NOT DETECTED NOT DETECTED   Streptococcus agalactiae NOT DETECTED NOT DETECTED   Streptococcus pneumoniae NOT DETECTED NOT DETECTED   Streptococcus pyogenes NOT DETECTED NOT DETECTED   A.calcoaceticus-baumannii NOT DETECTED NOT DETECTED   Bacteroides fragilis NOT DETECTED NOT DETECTED   Enterobacterales NOT DETECTED NOT DETECTED   Enterobacter cloacae complex NOT DETECTED NOT DETECTED   Escherichia coli NOT DETECTED NOT DETECTED   Klebsiella aerogenes NOT DETECTED NOT DETECTED   Klebsiella oxytoca NOT DETECTED NOT DETECTED   Klebsiella pneumoniae NOT DETECTED NOT DETECTED   Proteus species NOT DETECTED NOT DETECTED   Salmonella species NOT DETECTED NOT DETECTED   Serratia marcescens NOT DETECTED NOT DETECTED   Haemophilus influenzae NOT DETECTED NOT DETECTED   Neisseria meningitidis NOT DETECTED NOT DETECTED   Pseudomonas  aeruginosa NOT DETECTED NOT DETECTED   Stenotrophomonas maltophilia NOT DETECTED NOT DETECTED   Candida albicans NOT DETECTED NOT DETECTED   Candida auris NOT DETECTED NOT DETECTED   Candida glabrata NOT DETECTED NOT DETECTED   Candida krusei NOT DETECTED NOT DETECTED   Candida parapsilosis NOT DETECTED NOT DETECTED   Candida tropicalis NOT DETECTED NOT DETECTED   Cryptococcus neoformans/gattii NOT DETECTED NOT DETECTED   Methicillin resistance mecA/C DETECTED (A) NOT DETECTED    [name] 01/31/2023  8:42 AM

## 2023-01-31 NOTE — Plan of Care (Signed)
RESOLVE CARE PLAN. PATIENT ADEQUATE FOR DISCHARGE.  Elizabeth Shelton

## 2023-01-31 NOTE — Discharge Summary (Signed)
Patient ID: Elizabeth Shelton MRN: [id number] DOB/AGE: 1976-04-16 47 y.o.  Admit date: 01/29/2023 Discharge date: 01/31/2023   Discharge Diagnoses:  Principal Problem:   Acute cholecystitis   Procedures: Robotic assisted laparoscopic cholecystectomy  Hospital Course: Patient admitted with acute cholecystitis.  She underwent robotic assisted upper scopic cholecystectomy.  Difficult surgery.  Unable to see the cystic thought about able to close it from the inside the gallbladder.  She has been doing well.  Drainage with minimal serous output.  HIDA scan negative for bile leak.  Patient tolerating diet.  Pain controlled.  Patient ambulating.  Wounds are dry and clean.  Physical Exam Vitals reviewed.  Constitutional:      Appearance: She is well-developed.  Cardiovascular:     Rate and Rhythm: Normal rate and regular rhythm.  Pulmonary:     Effort: Pulmonary effort is normal.  Abdominal:     General: Abdomen is flat. Bowel sounds are normal.     Palpations: Abdomen is soft.  Skin:    General: Skin is warm.     Capillary Refill: Capillary refill takes less than 2 seconds.  Neurological:     Mental Status: She is alert and oriented to person, place, and time.      Consults: None  Disposition: Discharge disposition: 01-Home or Self Care       Discharge Instructions     Diet - low sodium heart healthy   Complete by: As directed    Increase activity slowly   Complete by: As directed       Allergies as of 01/31/2023       Reactions   Amoxicillin Hives   Has patient had a PCN reaction causing immediate rash, facial/tongue/throat swelling, SOB or lightheadedness with hypotension: No Has patient had a PCN reaction causing severe rash involving mucus membranes or skin necrosis: No Has patient had a PCN reaction that required hospitalization: No Has patient had a PCN reaction occurring within the last 10 years: Yes If all of the above answers are "NO", then may proceed with  Cephalosporin use.   Aspirin Hives   Codeine Hives      Sulfa Antibiotics Other (See Comments)   Joint pain  Intense joint pain Severe joint pain   Adhesive [tape] Other (See Comments)   Blisters skin and takes off top layer of skin when tape is removed-PAPER TAPE OK TO USE   Penicillins Hives   Has patient had a PCN reaction causing immediate rash, facial/tongue/throat swelling, SOB or lightheadedness with hypotension: No Has patient had a PCN reaction causing severe rash involving mucus membranes or skin necrosis: No Has patient had a PCN reaction that required hospitalization: No Has patient had a PCN reaction occurring within the last 10 years: Yes If all of the above answers are "NO", then may proceed with Cephalosporin use.        Medication List     TAKE these medications    cetirizine 10 MG tablet Commonly known as: ZYRTEC Take 10 mg by mouth daily.   esomeprazole 40 MG capsule Commonly known as: NEXIUM Take 40 mg by mouth daily at 12 noon.   HYDROcodone-acetaminophen 5-325 MG tablet Commonly known as: Norco Take 1 tablet by mouth every 4 (four) hours as needed for up to 3 days for moderate pain.   Magnesium Glycinate 100 MG Caps Take 1 capsule by mouth daily.   metoprolol succinate 25 MG 24 hr tablet Commonly known as: TOPROL-XL Take 25 mg by mouth daily.  Follow-up Information     Carolan Shiver, MD Follow up in 2 week(s).   Specialty: General Surgery Why: Follow up after cholecystectomy Contact information: [address] [place] [address] [phone]

## 2023-02-01 LAB — CULTURE, BLOOD (ROUTINE X 2)

## 2023-02-03 LAB — CULTURE, BLOOD (ROUTINE X 2)
Culture: NO GROWTH
Special Requests: ADEQUATE

## 2023-03-23 ENCOUNTER — Ambulatory Visit (INDEPENDENT_AMBULATORY_CARE_PROVIDER_SITE_OTHER): Payer: BC Managed Care – PPO | Admitting: Internal Medicine

## 2023-03-23 ENCOUNTER — Encounter: Payer: Self-pay | Admitting: Internal Medicine

## 2023-03-23 VITALS — BP 130/76 | HR 77 | Temp 97.9°F | Ht 64.0 in | Wt 164.9 lb

## 2023-03-23 DIAGNOSIS — Z1211 Encounter for screening for malignant neoplasm of colon: Secondary | ICD-10-CM

## 2023-03-23 DIAGNOSIS — Z0000 Encounter for general adult medical examination without abnormal findings: Secondary | ICD-10-CM

## 2023-03-23 DIAGNOSIS — I491 Atrial premature depolarization: Secondary | ICD-10-CM

## 2023-03-23 DIAGNOSIS — Z9109 Other allergy status, other than to drugs and biological substances: Secondary | ICD-10-CM | POA: Diagnosis not present

## 2023-03-23 DIAGNOSIS — Z13220 Encounter for screening for lipoid disorders: Secondary | ICD-10-CM

## 2023-03-23 DIAGNOSIS — K219 Gastro-esophageal reflux disease without esophagitis: Secondary | ICD-10-CM

## 2023-03-23 DIAGNOSIS — Z1231 Encounter for screening mammogram for malignant neoplasm of breast: Secondary | ICD-10-CM

## 2023-03-23 DIAGNOSIS — Z0001 Encounter for general adult medical examination with abnormal findings: Secondary | ICD-10-CM | POA: Diagnosis not present

## 2023-03-23 DIAGNOSIS — Z114 Encounter for screening for human immunodeficiency virus [HIV]: Secondary | ICD-10-CM

## 2023-03-23 DIAGNOSIS — Z1159 Encounter for screening for other viral diseases: Secondary | ICD-10-CM

## 2023-03-23 DIAGNOSIS — Z23 Encounter for immunization: Secondary | ICD-10-CM

## 2023-03-23 NOTE — Patient Instructions (Signed)
It was great seeing you today!  Plan discussed at today's visit: -Blood work ordered today, results will be uploaded to MyChart. Please return fasting any week day from 8:30-11:30 and 1:30-3:30 -Mammogram ordered, please call number on card to schedule -Cologuard ordered   Follow up in: 1 year or sooner as needed  Take care and let us know if you have any questions or concerns prior to your next visit.  Dr. Caralee Ates

## 2023-03-23 NOTE — Progress Notes (Signed)
New Patient Office Visit  Subjective    Patient ID: Elizabeth Shelton, female    DOB: 1975-09-29  Age: 47 y.o. MRN: [id number]  CC:  Chief Complaint  Patient presents with   Establish Care    Specialist:Cardiologist   Annual Exam    HPI Elizabeth Shelton presents to establish care.  PAC's/Nonsustained SVT: -Follows with Cardiology, last seen 09/16/22 -Currently on Metoprolol XL 25 mg daily - still has occasional break through symptoms but overall fairly controlled -Echo 9/22 negative   GERD: -Currently Esomeprazole 40 mg daily, controlling symptoms well  Environmental Allergies: -Currently on Zyrtec 10 mg  Health Maintenance: -Blood work: had labs last month in the hospital after gallbladder surgery -Pap - History of Hysterectomy 8 years ago, left ovary secondary to cyst -Mammogram due -Colon cancer screening due   Outpatient Encounter Medications as of 03/23/2023  Medication Sig   cetirizine (ZYRTEC) 10 MG tablet Take 10 mg by mouth daily.   esomeprazole (NEXIUM) 40 MG capsule Take 40 mg by mouth daily at 12 noon.   Magnesium Glycinate 100 MG CAPS Take 1 capsule by mouth daily.   metoprolol succinate (TOPROL-XL) 25 MG 24 hr tablet Take 25 mg by mouth daily.   No facility-administered encounter medications on file as of 03/23/2023.    Past Medical History:  Diagnosis Date   Anemia    Complication of anesthesia    Family history of adverse reaction to anesthesia    DAD AND PATERNAL GRANDFATHER N/V   GERD (gastroesophageal reflux disease)    Heart palpitations    PONV (postoperative nausea and vomiting)    Tachycardia    PT HAS HAD EKG'S AND WORN HOLTER 48 HOUR WHICH WERE ALL NORMAL    Past Surgical History:  Procedure Laterality Date   ABDOMINAL HYSTERECTOMY     CYSTOSCOPY N/A 01/29/2015   Procedure: CYSTOSCOPY;  Surgeon: Nadara Mustard, MD;  Location: [facility];  Service: Gynecology;  Laterality: N/A;   INTRAOPERATIVE CHOLANGIOGRAM N/A 01/29/2023   Procedure:  INTRAOPERATIVE CHOLANGIOGRAM;  Surgeon: Carolan Shiver, MD;  Location: [facility];  Service: General;  Laterality: N/A;   LAPAROSCOPIC BILATERAL SALPINGECTOMY Bilateral 01/29/2015   Procedure: LAPAROSCOPIC BILATERAL SALPINGECTOMY;  Surgeon: Nadara Mustard, MD;  Location: [facility];  Service: Gynecology;  Laterality: Bilateral;   LAPAROSCOPIC HYSTERECTOMY N/A 01/29/2015   Procedure: HYSTERECTOMY TOTAL LAPAROSCOPIC;  Surgeon: Nadara Mustard, MD;  Location: [facility];  Service: Gynecology;  Laterality: N/A;   LAPAROSCOPIC LYSIS OF ADHESIONS  02/22/2018   Procedure: LAPAROSCOPIC LYSIS OF ADHESIONS;  Surgeon: Nadara Mustard, MD;  Location: [facility];  Service: Gynecology;;   OOPHORECTOMY      Family History  Problem Relation Age of Onset   Hypercholesterolemia Mother 49   Irritable bowel syndrome Sister     Social History   Socioeconomic History   Marital status: Married    Spouse name: Not on file   Number of children: Not on file   Years of education: Not on file   Highest education level: Not on file  Occupational History   Not on file  Tobacco Use   Smoking status: Never   Smokeless tobacco: Never  Vaping Use   Vaping status: Never Used  Substance and Sexual Activity   Alcohol use: No   Drug use: No   Sexual activity: Yes  Other Topics Concern   Not on file  Social History Narrative   Not on file   Social Determinants of Health  Financial Resource Strain: Not on file  Food Insecurity: No Food Insecurity (01/29/2023)   Hunger Vital Sign    Worried About Running Out of Food in the Last Year: Never true    Ran Out of Food in the Last Year: Never true  Transportation Needs: No Transportation Needs (01/29/2023)   PRAPARE - Administrator, Civil Service (Medical): No    Lack of Transportation (Non-Medical): No  Physical Activity: Not on file  Stress: Not on file  Social Connections: Not on file  Intimate Partner Violence: Not At Risk (01/29/2023)   Humiliation,  Afraid, Rape, and Kick questionnaire    Fear of Current or Ex-Partner: No    Emotionally Abused: No    Physically Abused: No    Sexually Abused: No    Review of Systems  All other systems reviewed and are negative.       Objective    BP 130/76   Pulse 77   Temp 97.9 F (36.6 C)   Ht 5\' 4"  (1.626 m)   Wt 164 lb 14.4 oz (74.8 kg)   LMP  (LMP Unknown)   SpO2 99%   BMI 28.31 kg/m   Physical Exam Constitutional:      Appearance: Normal appearance.  HENT:     Head: Normocephalic and atraumatic.     Mouth/Throat:     Mouth: Mucous membranes are moist.     Pharynx: Oropharynx is clear.  Eyes:     Extraocular Movements: Extraocular movements intact.     Conjunctiva/sclera: Conjunctivae normal.     Pupils: Pupils are equal, round, and reactive to light.  Neck:     Comments: No thyromegaly  Cardiovascular:     Rate and Rhythm: Normal rate and regular rhythm.  Pulmonary:     Effort: Pulmonary effort is normal.     Breath sounds: Normal breath sounds.  Musculoskeletal:     Cervical back: No tenderness.     Right lower leg: No edema.     Left lower leg: No edema.  Lymphadenopathy:     Cervical: No cervical adenopathy.  Skin:    General: Skin is warm and dry.  Neurological:     General: No focal deficit present.     Mental Status: She is alert. Mental status is at baseline.  Psychiatric:        Mood and Affect: Mood normal.        Behavior: Behavior normal.         Assessment & Plan:   1. Annual physical exam/Lipid screening/Need for hepatitis C screening test/Encounter for screening for HIV: Will return for fasting labs.  - CBC w/Diff/Platelet - COMPLETE METABOLIC PANEL WITH GFR - Lipid Profile - Hepatitis C Antibody - HIV antibody (with reflex)  2. PAC (premature atrial contraction): Following with Cardiology, note reviewed from 09/16/22. Currently on Metoprolol XL 25 mg daily.  3. Gastroesophageal reflux disease, unspecified whether esophagitis  present: Stable, will decrease to Nexium 20 mg daily.   4. Environmental allergies: Stable, continue Zyrtec.   5. Encounter for screening mammogram for malignant neoplasm of breast: Mammogram ordered.   - MM 3D SCREENING MAMMOGRAM BILATERAL BREAST; Future  6. Screening for colon cancer: Cologuard ordered.   - Cologuard  7. Need for influenza vaccination: Flu vaccine administered today.   - Flu vaccine trivalent PF, 6mos and older(Flulaval,Afluria,Fluarix,Fluzone)   Return in about 1 year (around 03/22/2024).   Margarita Mail, DO

## 2023-04-25 DIAGNOSIS — Z1211 Encounter for screening for malignant neoplasm of colon: Secondary | ICD-10-CM | POA: Diagnosis not present

## 2023-04-27 ENCOUNTER — Ambulatory Visit
Admission: RE | Admit: 2023-04-27 | Discharge: 2023-04-27 | Disposition: A | Discharge status: Home / Self Care | Payer: BC Managed Care – PPO | Source: Ambulatory Visit | Attending: Internal Medicine | Admitting: Internal Medicine

## 2023-04-27 DIAGNOSIS — Z1231 Encounter for screening mammogram for malignant neoplasm of breast: Secondary | ICD-10-CM | POA: Insufficient documentation

## 2023-04-28 DIAGNOSIS — Z0000 Encounter for general adult medical examination without abnormal findings: Secondary | ICD-10-CM | POA: Diagnosis not present

## 2023-04-28 DIAGNOSIS — Z13220 Encounter for screening for lipoid disorders: Secondary | ICD-10-CM | POA: Diagnosis not present

## 2023-04-28 DIAGNOSIS — Z114 Encounter for screening for human immunodeficiency virus [HIV]: Secondary | ICD-10-CM | POA: Diagnosis not present

## 2023-04-28 DIAGNOSIS — Z1159 Encounter for screening for other viral diseases: Secondary | ICD-10-CM | POA: Diagnosis not present

## 2023-04-29 LAB — COMPLETE METABOLIC PANEL WITH GFR
AG Ratio: 1.5 (calc) (ref 1.0–2.5)
ALT: 13 U/L (ref 6–29)
AST: 14 U/L (ref 10–35)
Albumin: 4.1 g/dL (ref 3.6–5.1)
Alkaline phosphatase (APISO): 79 U/L (ref 31–125)
BUN: 9 mg/dL (ref 7–25)
CO2: 27 mmol/L (ref 20–32)
Calcium: 9.4 mg/dL (ref 8.6–10.2)
Chloride: 104 mmol/L (ref 98–110)
Creat: 0.62 mg/dL (ref 0.50–0.99)
Globulin: 2.8 g/dL (ref 1.9–3.7)
Glucose, Bld: 97 mg/dL (ref 65–99)
Potassium: 4.4 mmol/L (ref 3.5–5.3)
Sodium: 139 mmol/L (ref 135–146)
Total Bilirubin: 0.5 mg/dL (ref 0.2–1.2)
Total Protein: 6.9 g/dL (ref 6.1–8.1)
eGFR: 111 mL/min/{1.73_m2} (ref >=60)

## 2023-04-29 LAB — CBC WITH DIFFERENTIAL/PLATELET
Absolute Monocytes: 410 {cells}/uL (ref 200–950)
Basophils Absolute: 52 {cells}/uL (ref 0–200)
Basophils Relative: 0.8 %
Eosinophils Absolute: 130 {cells}/uL (ref 15–500)
Eosinophils Relative: 2 %
HCT: 41.5 % (ref 35.0–45.0)
Hemoglobin: 13.8 g/dL (ref 11.7–15.5)
Lymphs Abs: 2165 {cells}/uL (ref 850–3900)
MCH: 30 pg (ref 27.0–33.0)
MCHC: 33.3 g/dL (ref 32.0–36.0)
MCV: 90.2 fL (ref 80.0–100.0)
MPV: 10.2 fL (ref 7.5–12.5)
Monocytes Relative: 6.3 %
Neutro Abs: 3744 {cells}/uL (ref 1500–7800)
Neutrophils Relative %: 57.6 %
Platelets: 373 10*3/uL (ref 140–400)
RBC: 4.6 10*6/uL (ref 3.80–5.10)
RDW: 12.3 % (ref 11.0–15.0)
Total Lymphocyte: 33.3 %
WBC: 6.5 10*3/uL (ref 3.8–10.8)

## 2023-04-29 LAB — HIV ANTIBODY (ROUTINE TESTING W REFLEX): HIV 1&2 Ab, 4th Generation: NONREACTIVE

## 2023-04-29 LAB — LIPID PANEL
Cholesterol: 203 mg/dL — ABNORMAL HIGH (ref <200)
HDL: 65 mg/dL (ref >=50)
LDL Cholesterol (Calc): 116 mg/dL — ABNORMAL HIGH
Non-HDL Cholesterol (Calc): 138 mg/dL — ABNORMAL HIGH (ref <130)
Total CHOL/HDL Ratio: 3.1 (calc) (ref <5.0)
Triglycerides: 110 mg/dL (ref <150)

## 2023-04-29 LAB — HEPATITIS C ANTIBODY: Hepatitis C Ab: NONREACTIVE

## 2023-05-01 LAB — COLOGUARD: COLOGUARD: NEGATIVE

## 2023-08-13 DIAGNOSIS — H109 Unspecified conjunctivitis: Secondary | ICD-10-CM | POA: Diagnosis not present

## 2023-08-15 DIAGNOSIS — B309 Viral conjunctivitis, unspecified: Secondary | ICD-10-CM | POA: Diagnosis not present

## 2023-08-15 DIAGNOSIS — H40013 Open angle with borderline findings, low risk, bilateral: Secondary | ICD-10-CM | POA: Diagnosis not present

## 2023-08-15 DIAGNOSIS — Z8669 Personal history of other diseases of the nervous system and sense organs: Secondary | ICD-10-CM | POA: Diagnosis not present

## 2023-10-09 ENCOUNTER — Telehealth: Payer: Self-pay

## 2023-10-09 ENCOUNTER — Telehealth: Admitting: Physician Assistant

## 2023-10-09 ENCOUNTER — Encounter: Payer: Self-pay | Admitting: Physician Assistant

## 2023-10-09 DIAGNOSIS — J101 Influenza due to other identified influenza virus with other respiratory manifestations: Secondary | ICD-10-CM | POA: Diagnosis not present

## 2023-10-09 MED ORDER — OSELTAMIVIR PHOSPHATE 75 MG PO CAPS
75.0000 mg | ORAL_CAPSULE | Freq: Two times a day (BID) | ORAL | 0 refills | Status: AC
Start: 2023-10-09 — End: 2023-10-14

## 2023-10-09 NOTE — Progress Notes (Signed)
 Virtual Visit Consent   Elizabeth Shelton, you are scheduled for a virtual visit with a  provider today. Just as with appointments in the office, your consent must be obtained to participate. Your consent will be active for this visit and any virtual visit you may have with one of our providers in the next 365 days. If you have a MyChart account, a copy of this consent can be sent to you electronically.  As this is a virtual visit, video technology does not allow for your provider to perform a traditional examination. This may limit your provider's ability to fully assess your condition. If your provider identifies any concerns that need to be evaluated in person or the need to arrange testing (such as labs, EKG, etc.), we will make arrangements to do so. Although advances in technology are sophisticated, we cannot ensure that it will always work on either your end or our end. If the connection with a video visit is poor, the visit may have to be switched to a telephone visit. With either a video or telephone visit, we are not always able to ensure that we have a secure connection.  By engaging in this virtual visit, you consent to the provision of healthcare and authorize for your insurance to be billed (if applicable) for the services provided during this visit. Depending on your insurance coverage, you may receive a charge related to this service.  I need to obtain your verbal consent now. Are you willing to proceed with your visit today? Elizabeth Shelton has provided verbal consent on 10/09/2023 for a virtual visit (video or telephone). Gilberto Better, [place]  Date: 10/09/2023 5:46 PM   Virtual Visit via Video Note   I, Elizabeth Shelton, connected with  Elizabeth Shelton  ([id number], 1975/11/02) on 10/09/23 at  5:45 PM EDT by a video-enabled telemedicine application and verified that I am speaking with the correct person using two identifiers.  Location: Patient: Virtual Visit Location Patient:  Home Provider: Virtual Visit Location Provider: Home Office   I discussed the limitations of evaluation and management by telemedicine and the availability of in person appointments. The patient expressed understanding and agreed to proceed.    History of Present Illness: Elizabeth Shelton is a 48 y.o. who identifies as a female who was assigned female at birth, and is being seen today for flu symptoms.  HPI: 48 y/o F presents via video telehealth visit for c/o flu symptoms including fever with a max temp. Of 101.3 degrees F, cough, head congestion, and body aches x 36 hours. +known exposure to flu by her two kids. Took a home flu test which was positive for type A.   URI     Problems:  Patient Active Problem List   Diagnosis Date Noted   Acute cholecystitis 01/29/2023   LLQ pain 02/19/2018   Panic attack 02/23/2015   Endometriosis determined by laparoscopy 01/29/2015   S/P hysterectomy 01/29/2015    Allergies:  Allergies  Allergen Reactions   Amoxicillin Hives    Has patient had a PCN reaction causing immediate rash, facial/tongue/throat swelling, SOB or lightheadedness with hypotension: No Has patient had a PCN reaction causing severe rash involving mucus membranes or skin necrosis: No Has patient had a PCN reaction that required hospitalization: No Has patient had a PCN reaction occurring within the last 10 years: Yes If all of the above answers are "NO", then may proceed with Cephalosporin use.    Aspirin Hives   Codeine  Hives        Sulfa Antibiotics Other (See Comments)    Joint pain  Intense joint pain Severe joint pain   Adhesive [Tape] Other (See Comments)    Blisters skin and takes off top layer of skin when tape is removed-PAPER TAPE OK TO USE   Penicillins Hives    Has patient had a PCN reaction causing immediate rash, facial/tongue/throat swelling, SOB or lightheadedness with hypotension: No Has patient had a PCN reaction causing severe rash involving mucus  membranes or skin necrosis: No Has patient had a PCN reaction that required hospitalization: No Has patient had a PCN reaction occurring within the last 10 years: Yes If all of the above answers are "NO", then may proceed with Cephalosporin use.    Medications:  Current Outpatient Medications:    oseltamivir (TAMIFLU) 75 MG capsule, Take 1 capsule (75 mg total) by mouth 2 (two) times daily for 5 days., Disp: 10 capsule, Rfl: 0   cetirizine (ZYRTEC) 10 MG tablet, Take 10 mg by mouth daily., Disp: , Rfl:    esomeprazole (NEXIUM) 40 MG capsule, Take 40 mg by mouth daily at 12 noon., Disp: , Rfl:    Magnesium Glycinate 100 MG CAPS, Take 1 capsule by mouth daily., Disp: , Rfl:    metoprolol succinate (TOPROL-XL) 25 MG 24 hr tablet, Take 25 mg by mouth daily., Disp: , Rfl:   Observations/Objective: Patient is well-developed, well-nourished in no acute distress.  Resting comfortably  at home.  Head is normocephalic, atraumatic.  No labored breathing.  Speech is clear and coherent with logical content.  Patient is alert and oriented at baseline.    Assessment and Plan: 1. Type A influenza (Primary) - oseltamivir (TAMIFLU) 75 MG capsule; Take 1 capsule (75 mg total) by mouth 2 (two) times daily for 5 days.  Dispense: 10 capsule; Refill: 0  Increase fluids REST Take Ibuprofen or Tylenol for fever and body aches. Consider Tamiflu to help with flu symptoms. Reviewed common side effects to Tamiflu including headache and GI distress.  Pt verbalized understanding and in agreement.    Follow Up Instructions: I discussed the assessment and treatment plan with the patient. The patient was provided an opportunity to ask questions and all were answered. The patient agreed with the plan and demonstrated an understanding of the instructions.  A copy of instructions were sent to the patient via MyChart unless otherwise noted below.   Patient has requested to receive PHI (AVS, Work Notes, etc)  pertaining to this video visit through e-mail as they are currently without active MyChart. They have voiced understand that email is not considered secure and their health information could be viewed by someone other than the patient.   The patient was advised to call back or seek an in-person evaluation if the symptoms worsen or if the condition fails to improve as anticipated.    Gilberto Better, PA-C

## 2023-10-09 NOTE — Patient Instructions (Signed)
  Kerrin Champagne, thank you for joining Gilberto Better, PA-C for today's virtual visit.  While this provider is not your primary care provider (PCP), if your PCP is located in our provider database this encounter information will be shared with them immediately following your visit.   A [facility] MyChart account gives you access to today's visit and all your visits, tests, and labs performed at [facility] " click here if you don't have a [facility] MyChart account or go to [email or web address]  Consent: (Patient) Kerrin Champagne provided verbal consent for this virtual visit at the beginning of the encounter.  Current Medications:  Current Outpatient Medications:    oseltamivir (TAMIFLU) 75 MG capsule, Take 1 capsule (75 mg total) by mouth 2 (two) times daily for 5 days., Disp: 10 capsule, Rfl: 0   cetirizine (ZYRTEC) 10 MG tablet, Take 10 mg by mouth daily., Disp: , Rfl:    esomeprazole (NEXIUM) 40 MG capsule, Take 40 mg by mouth daily at 12 noon., Disp: , Rfl:    Magnesium Glycinate 100 MG CAPS, Take 1 capsule by mouth daily., Disp: , Rfl:    metoprolol succinate (TOPROL-XL) 25 MG 24 hr tablet, Take 25 mg by mouth daily., Disp: , Rfl:    Medications ordered in this encounter:  Meds ordered this encounter  Medications   oseltamivir (TAMIFLU) 75 MG capsule    Sig: Take 1 capsule (75 mg total) by mouth 2 (two) times daily for 5 days.    Dispense:  10 capsule    Refill:  0    Supervising Provider:   Merrilee Jansky [[id number]]     *If you need refills on other medications prior to your next appointment, please contact your pharmacy*  Follow-Up: Call back or seek an in-person evaluation if the symptoms worsen or if the condition fails to improve as anticipated.  [facility] [phone]  Other Instructions Increase fluids REST Take Ibuprofen or Tylenol for fever and body aches. Consider Tamiflu to help with flu symptoms. Reviewed common side  effects to Tamiflu including headache and GI distress.    If you have been instructed to have an in-person evaluation today at a local Urgent Care facility, please use the link below. It will take you to a list of all of our available [facility], including address, phone number and hours of operation. Please do not delay care.  [facility]  If you or a family member do not have a primary care provider, use the link below to schedule a visit and establish care. When you choose a [facility] primary care physician or advanced practice provider, you gain a long-term partner in health. Find a Primary Care Provider  Learn more about [facility]'s in-office and virtual care options: [facility] - Get Care Now

## 2023-10-09 NOTE — Telephone Encounter (Signed)
 Pt needs an appt   Copied from CRM [id number]. Topic: Clinical - Medical Advice >> Oct 09, 2023  1:41 PM Antwanette L wrote: Reason for CRM: The patient took an at home Covid/ flu test and the patient tested positive for influenza A. The patient wants to know if Dr. Caralee Ates can prescribe her some Tamiflu. The patient can be contacted by phone at [phone].  Preferred Pharmacy [id number] [place], [place] - [address]  Phone: [phone] Fax: [phone]

## 2023-10-09 NOTE — Telephone Encounter (Signed)
 I called the pt to have her schedule an appt . First available is 10/10/23, pt declined and stated she was going to urgent care

## 2023-11-16 DIAGNOSIS — L718 Other rosacea: Secondary | ICD-10-CM | POA: Diagnosis not present

## 2023-11-16 DIAGNOSIS — L2089 Other atopic dermatitis: Secondary | ICD-10-CM | POA: Diagnosis not present

## 2023-11-17 DIAGNOSIS — K219 Gastro-esophageal reflux disease without esophagitis: Secondary | ICD-10-CM | POA: Diagnosis not present

## 2023-11-17 DIAGNOSIS — R079 Chest pain, unspecified: Secondary | ICD-10-CM | POA: Diagnosis not present

## 2023-11-17 DIAGNOSIS — I491 Atrial premature depolarization: Secondary | ICD-10-CM | POA: Diagnosis not present

## 2023-11-17 DIAGNOSIS — R000 Tachycardia, unspecified: Secondary | ICD-10-CM | POA: Diagnosis not present

## 2024-03-29 ENCOUNTER — Other Ambulatory Visit: Payer: Self-pay

## 2024-03-29 ENCOUNTER — Ambulatory Visit (INDEPENDENT_AMBULATORY_CARE_PROVIDER_SITE_OTHER): Payer: Self-pay | Admitting: Internal Medicine

## 2024-03-29 VITALS — BP 120/76 | HR 73 | Temp 98.0°F | Resp 16 | Ht 64.0 in | Wt 163.6 lb

## 2024-03-29 DIAGNOSIS — Z13220 Encounter for screening for lipoid disorders: Secondary | ICD-10-CM

## 2024-03-29 DIAGNOSIS — Z1231 Encounter for screening mammogram for malignant neoplasm of breast: Secondary | ICD-10-CM

## 2024-03-29 DIAGNOSIS — J3089 Other allergic rhinitis: Secondary | ICD-10-CM | POA: Diagnosis not present

## 2024-03-29 DIAGNOSIS — Z23 Encounter for immunization: Secondary | ICD-10-CM

## 2024-03-29 DIAGNOSIS — I491 Atrial premature depolarization: Secondary | ICD-10-CM

## 2024-03-29 DIAGNOSIS — R7303 Prediabetes: Secondary | ICD-10-CM | POA: Diagnosis not present

## 2024-03-29 DIAGNOSIS — Z566 Other physical and mental strain related to work: Secondary | ICD-10-CM | POA: Diagnosis not present

## 2024-03-29 DIAGNOSIS — Z0000 Encounter for general adult medical examination without abnormal findings: Secondary | ICD-10-CM

## 2024-03-29 MED ORDER — METOPROLOL SUCCINATE ER 25 MG PO TB24: 25.0000 mg | ORAL_TABLET | Freq: Every day | ORAL | 3 refills | Status: AC

## 2024-03-29 NOTE — Patient Instructions (Signed)
 Elizabeth Shelton

## 2024-03-29 NOTE — Progress Notes (Signed)
 Name: Elizabeth Shelton   MRN: [id number]    DOB: 06-01-1976   Date:03/29/2024       Progress Note  Subjective  Chief Complaint  Chief Complaint  Patient presents with   Annual Exam    HPI  Patient presents for annual CPE.  Discussed the use of AI scribe software for clinical note transcription with the patient, who gave verbal consent to proceed.  History of Present Illness [name] is a 48 year old female who presents for an annual physical exam.  She takes metoprolol  25 mg daily for heart palpitations. She has lost nearly 20 pounds since February on a low-carb diet due to borderline diabetic A1c levels, though she recently regained 3-4 pounds. Her A1c was 5.7% in February. Her acid reflux symptoms have resolved with weight loss, and she no longer takes Nexium. She is concerned about her blood sugar levels due to her family history of diabetes.  She experiences anxiety and stress, particularly at work, and takes Ashwagandha gummies, which help reduce her stress levels.  She takes Zyrtec daily for seasonal allergies, which worsen in the fall, and is interested in additional treatments for sinus congestion.  She had a tetanus vaccine within the last ten years, likely around 2018, and is open to receiving a booster if necessary.   Diet: (Keto) - doing a lifestyle change through her insurance called [employer] diet and has lost 20 pounds Exercise:  1 time a week 20 minutes Last Eye Exam: completed Last Dental Exam: completed  Flowsheet Row Office Visit from 03/29/2024 in [facility]  AUDIT-C Score 0   Depression: Phq 9 is  negative    03/29/2024    8:03 AM 03/23/2023    2:48 PM  Depression screen PHQ 2/9  Decreased Interest 0 0  Down, Depressed, Hopeless 0 0  PHQ - 2 Score 0 0  Altered sleeping  0  Tired, decreased energy  0  Change in appetite  0  Feeling bad or failure about yourself   0  Trouble concentrating  0  Moving slowly or  fidgety/restless  0  Suicidal thoughts  0  PHQ-9 Score  0  Difficult doing work/chores  Not difficult at all   Hypertension: BP Readings from Last 3 Encounters:  03/29/24 120/76  03/23/23 130/76  01/31/23 100/69   Obesity: Wt Readings from Last 3 Encounters:  03/29/24 163 lb 9.6 oz (74.2 kg)  03/23/23 164 lb 14.4 oz (74.8 kg)  01/29/23 161 lb (73 kg)   BMI Readings from Last 3 Encounters:  03/29/24 28.08 kg/m  03/23/23 28.31 kg/m  01/29/23 26.79 kg/m     Vaccines: reviewed with the patient. Tdap due.   Hep C Screening: completed STD testing and prevention (HIV/chl/gon/syphilis): no concerns Intimate partner violence: negative screen  Sexual History : active Menstrual History/LMP/Abnormal Bleeding: Hysterectomy Discussed importance of follow up if any post-menopausal bleeding: no  Incontinence Symptoms: negative for symptoms   Breast cancer:  - Last Mammogram: 04/27/2023, due and ordered  Osteoporosis Prevention : Discussed high calcium and vitamin D  supplementation, weight bearing exercises Bone density :not applicable   Cervical cancer screening: not applicable due to hysterectomy  Skin cancer: Discussed monitoring for atypical lesions  Colorectal cancer: Cologuard in 2024 negative  Lung cancer:  Low Dose CT Chest recommended if Age 44-80 years, 20 pack-year currently smoking OR have quit w/in 15years. Patient does not qualify for screen   ECG: 01/30/2023  Advanced Care Planning: A voluntary  discussion about advance care planning including the explanation and discussion of advance directives.  Discussed health care proxy and Living will, and the patient was able to identify a health care proxy as Nikiyah Fackler (husband).  Patient does not have a living will and power of attorney of health care   Patient Active Problem List   Diagnosis Date Noted   Acute cholecystitis 01/29/2023   LLQ pain 02/19/2018   Panic attack 02/23/2015   Endometriosis determined by  laparoscopy 01/29/2015   S/P hysterectomy 01/29/2015    Past Surgical History:  Procedure Laterality Date   ABDOMINAL HYSTERECTOMY     CYSTOSCOPY N/A 01/29/2015   Procedure: CYSTOSCOPY;  Surgeon: Lamar SHAUNNA Lesches, MD;  Location: [facility] OR[facility];  Service: Gynecology;  Laterality: N/A;   INTRAOPERATIVE CHOLANGIOGRAM N/A 01/29/2023   Procedure: INTRAOPERATIVE CHOLANGIOGRAM;  Surgeon: Rodolph Romano, MD;  Location: [facility];  Service: General;  Laterality: N/A;   LAPAROSCOPIC BILATERAL SALPINGECTOMY Bilateral 01/29/2015   Procedure: LAPAROSCOPIC BILATERAL SALPINGECTOMY;  Surgeon: Lamar SHAUNNA Lesches, MD;  Location: [facility];  Service: Gynecology;  Laterality: Bilateral;   LAPAROSCOPIC HYSTERECTOMY N/A 01/29/2015   Procedure: HYSTERECTOMY TOTAL LAPAROSCOPIC;  Surgeon: Lamar SHAUNNA Lesches, MD;  Location: [facility];  Service: Gynecology;  Laterality: N/A;   LAPAROSCOPIC LYSIS OF ADHESIONS  02/22/2018   Procedure: LAPAROSCOPIC LYSIS OF ADHESIONS;  Surgeon: Lesches Lamar SHAUNNA, MD;  Location: [facility];  Service: Gynecology;;   OOPHORECTOMY      Family History  Problem Relation Age of Onset   Hypercholesterolemia Mother 83   Irritable bowel syndrome Sister    Breast cancer Neg Hx     Social History   Socioeconomic History   Marital status: Married    Spouse name: Not on file   Number of children: Not on file   Years of education: Not on file   Highest education level: Associate degree: occupational, Scientist, product/process development, or vocational program  Occupational History   Not on file  Tobacco Use   Smoking status: Never   Smokeless tobacco: Never  Vaping Use   Vaping status: Never Used  Substance and Sexual Activity   Alcohol use: No   Drug use: No   Sexual activity: Yes  Other Topics Concern   Not on file  Social History Narrative   Not on file   Social Drivers of Health   Financial Resource Strain: Low Risk  (03/29/2024)   Overall Financial Resource Strain (CARDIA)    Difficulty of Paying Living Expenses: Not  hard at all  Food Insecurity: No Food Insecurity (03/29/2024)   Hunger Vital Sign    Worried About Running Out of Food in the Last Year: Never true    Ran Out of Food in the Last Year: Never true  Transportation Needs: No Transportation Needs (03/29/2024)   PRAPARE - Administrator, Civil Service (Medical): No    Lack of Transportation (Non-Medical): No  Physical Activity: Insufficiently Active (03/29/2024)   Exercise Vital Sign    Days of Exercise per Week: 1 day    Minutes of Exercise per Session: 30 min  Stress: No Stress Concern Present (03/29/2024)   [employer] of Occupational Health - Occupational Stress Questionnaire    Feeling of Stress: Only a little  Social Connections: Socially Integrated (03/29/2024)   Social Connection and Isolation Panel    Frequency of Communication with Friends and Family: Twice a week    Frequency of Social Gatherings with Friends and Family: Once a week    Attends  Religious Services: More than 4 times per year    Active Member of Clubs or Organizations: Yes    Attends Banker Meetings: More than 4 times per year    Marital Status: Married  Catering manager Violence: Not At Risk (03/29/2024)   Humiliation, Afraid, Rape, and Kick questionnaire    Fear of Current or Ex-Partner: No    Emotionally Abused: No    Physically Abused: No    Sexually Abused: No     Current Outpatient Medications:    cetirizine (ZYRTEC) 10 MG tablet, Take 10 mg by mouth daily., Disp: , Rfl:    Magnesium Glycinate 100 MG CAPS, Take 1 capsule by mouth daily., Disp: , Rfl:    metoprolol  succinate (TOPROL -XL) 25 MG 24 hr tablet, Take 25 mg by mouth daily., Disp: , Rfl:    esomeprazole (NEXIUM) 40 MG capsule, Take 40 mg by mouth daily at 12 noon., Disp: , Rfl:   Allergies  Allergen Reactions   Amoxicillin Hives    Has patient had a PCN reaction causing immediate rash, facial/tongue/throat swelling, SOB or lightheadedness with hypotension: No Has patient  had a PCN reaction causing severe rash involving mucus membranes or skin necrosis: No Has patient had a PCN reaction that required hospitalization: No Has patient had a PCN reaction occurring within the last 10 years: Yes If all of the above answers are NO, then may proceed with Cephalosporin use.    Aspirin Hives   Codeine Hives        Sulfa Antibiotics Other (See Comments)    Joint pain  Intense joint pain Severe joint pain   Adhesive [Tape] Other (See Comments)    Blisters skin and takes off top layer of skin when tape is removed-PAPER TAPE OK TO USE   Penicillins Hives    Has patient had a PCN reaction causing immediate rash, facial/tongue/throat swelling, SOB or lightheadedness with hypotension: No Has patient had a PCN reaction causing severe rash involving mucus membranes or skin necrosis: No Has patient had a PCN reaction that required hospitalization: No Has patient had a PCN reaction occurring within the last 10 years: Yes If all of the above answers are NO, then may proceed with Cephalosporin use.      Review of Systems  All other systems reviewed and are negative.    Objective  Vitals:   03/29/24 0802  BP: 120/76  Pulse: 73  Resp: 16  Temp: 98 F (36.7 C)  TempSrc: Oral  SpO2: 99%  Weight: 163 lb 9.6 oz (74.2 kg)  Height: 5' 4 (1.626 m)    Body mass index is 28.08 kg/m.  Physical Exam Constitutional:      Appearance: Normal appearance.  HENT:     Head: Normocephalic and atraumatic.     Mouth/Throat:     Mouth: Mucous membranes are moist.     Pharynx: Oropharynx is clear.  Eyes:     Extraocular Movements: Extraocular movements intact.     Conjunctiva/sclera: Conjunctivae normal.     Pupils: Pupils are equal, round, and reactive to light.  Neck:     Comments: No thyromegaly Cardiovascular:     Rate and Rhythm: Normal rate and regular rhythm.  Pulmonary:     Effort: Pulmonary effort is normal.     Breath sounds: Normal breath sounds.   Musculoskeletal:     Cervical back: No tenderness.     Right lower leg: No edema.     Left lower leg: No edema.  Lymphadenopathy:     Cervical: No cervical adenopathy.  Skin:    General: Skin is warm and dry.  Neurological:     General: No focal deficit present.     Mental Status: She is alert. Mental status is at baseline.  Psychiatric:        Mood and Affect: Mood normal.        Behavior: Behavior normal.     Last CBC Lab Results  Component Value Date   WBC 6.5 04/28/2023   HGB 13.8 04/28/2023   HCT 41.5 04/28/2023   MCV 90.2 04/28/2023   MCH 30.0 04/28/2023   RDW 12.3 04/28/2023   PLT 373 04/28/2023   Last metabolic panel Lab Results  Component Value Date   GLUCOSE 97 04/28/2023   NA 139 04/28/2023   K 4.4 04/28/2023   CL 104 04/28/2023   CO2 27 04/28/2023   BUN 9 04/28/2023   CREATININE 0.62 04/28/2023   EGFR 111 04/28/2023   CALCIUM 9.4 04/28/2023   PROT 6.9 04/28/2023   ALBUMIN 3.6 01/29/2023   BILITOT 0.5 04/28/2023   ALKPHOS 73 01/29/2023   AST 14 04/28/2023   ALT 13 04/28/2023   ANIONGAP 8 01/31/2023   Last lipids Lab Results  Component Value Date   CHOL 203 (H) 04/28/2023   HDL 65 04/28/2023   LDLCALC 116 (H) 04/28/2023   TRIG 110 04/28/2023   CHOLHDL 3.1 04/28/2023   Last hemoglobin A1c Lab Results  Component Value Date   HGBA1C 5.3 05/11/2018   Last thyroid  functions Lab Results  Component Value Date   TSH 1.680 05/11/2018   Last vitamin D  Lab Results  Component Value Date   VD25OH 20.9 (L) 05/11/2018   Last vitamin B12 and Folate No results found for: VITAMINB12, FOLATE    Assessment & Plan  Assessment and Plan Assessment & Plan Adult Wellness Visit Blood pressure optimal, weight decreased. Discussed dietary habits and weight management strategies, including Virta program and low-carb diet. Emphasized balanced diet with increased protein and water, decreased carbohydrates to prevent diabetes progression. - Order  labs: A1c, kidney, liver, electrolytes, cholesterol, hemoglobin. - Order mammogram. - Administer tetanus vaccine. - Schedule colon cancer screening in 3 years.  Atrial premature depolarization Condition well-managed, no recent palpitations. Continues on metoprolol  25 mg once daily as prescribed by cardiologist. - Continue metoprolol  25 mg once daily.  Prediabetes Previously borderline diabetic with A1c at lower end of prediabetic range. Engaged in low-carb diet through [employer], resulting in weight loss and improved blood sugar control. Emphasized importance of maintaining healthy diet to prevent progression to diabetes. - Check A1c with current labs. - Encourage continuation of low-carb diet and weight management.  Obesity Weight loss achieved through [employer], nearly 20 pounds lost. Discussed challenges of maintaining low-carb diet and strategies for sustainable weight loss. Encouraged balanced diet with increased protein and water, decreased carbohydrates. - Encourage continuation of weight management strategies. - Monitor weight and dietary habits.  Seasonal allergic rhinitis Experiencing nasal congestion and sinus issues, particularly in fall. Currently taking Zyrtec daily. Discussed potential adaptation to Zyrtec and use of additional treatments. - Recommend Astepro nasal spray for additional allergy relief. - Suggest nasal saline rinses to clear nasal passages. - Consider alternating antihistamines seasonally to prevent adaptation.  Anxiety disorder Experiencing stress and anxiety, particularly at work. Currently taking Ashwagandha supplements, which have been helpful. - Continue Ashwagandha supplements for stress and anxiety management.  - CBC w/Diff/Platelet - Comprehensive Metabolic Panel (CMET) - Lipid Profile -  MM 3D SCREENING MAMMOGRAM BILATERAL BREAST; Future - metoprolol  succinate (TOPROL -XL) 25 MG 24 hr tablet; Take 1 tablet (25 mg total) by mouth daily.   Dispense: 90 tablet; Refill: 3 - HgB A1c - Tdap vaccine greater than or equal to 47yo IM - MM 3D SCREENING MAMMOGRAM BILATERAL BREAST; Future - metoprolol  succinate (TOPROL -XL) 25 MG 24 hr tablet; Take 1 tablet (25 mg total) by mouth daily.  Dispense: 90 tablet; Refill: 3    -USPSTF grade A and B recommendations reviewed with patient; age-appropriate recommendations, preventive care, screening tests, etc discussed and encouraged; healthy living encouraged; see AVS for patient education given to patient -Discussed importance of 150 minutes of physical activity weekly, eat two servings of fish weekly, eat one serving of tree nuts ( cashews, pistachios, pecans, almonds.SABRA) every other day, eat 6 servings of fruit/vegetables daily and drink plenty of water and avoid sweet beverages.   -Reviewed Health Maintenance: Yes.

## 2024-03-30 LAB — CBC WITH DIFFERENTIAL/PLATELET
Absolute Lymphocytes: 1787 {cells}/uL (ref 850–3900)
Absolute Monocytes: 421 {cells}/uL (ref 200–950)
Basophils Absolute: 61 {cells}/uL (ref 0–200)
Basophils Relative: 1 %
Eosinophils Absolute: 98 {cells}/uL (ref 15–500)
Eosinophils Relative: 1.6 %
HCT: 40.5 % (ref 35.0–45.0)
Hemoglobin: 13.7 g/dL (ref 11.7–15.5)
MCH: 31 pg (ref 27.0–33.0)
MCHC: 33.8 g/dL (ref 32.0–36.0)
MCV: 91.6 fL (ref 80.0–100.0)
MPV: 10.2 fL (ref 7.5–12.5)
Monocytes Relative: 6.9 %
Neutro Abs: 3733 {cells}/uL (ref 1500–7800)
Neutrophils Relative %: 61.2 %
Platelets: 342 Thousand/uL (ref 140–400)
RBC: 4.42 Million/uL (ref 3.80–5.10)
RDW: 11.9 % (ref 11.0–15.0)
Total Lymphocyte: 29.3 %
WBC: 6.1 Thousand/uL (ref 3.8–10.8)

## 2024-03-30 LAB — LIPID PANEL
Cholesterol: 184 mg/dL (ref <200)
HDL: 63 mg/dL (ref >=50)
LDL Cholesterol (Calc): 101 mg/dL — ABNORMAL HIGH
Non-HDL Cholesterol (Calc): 121 mg/dL (ref <130)
Total CHOL/HDL Ratio: 2.9 (calc) (ref <5.0)
Triglycerides: 100 mg/dL (ref <150)

## 2024-03-30 LAB — COMPREHENSIVE METABOLIC PANEL WITH GFR
AG Ratio: 1.6 (calc) (ref 1.0–2.5)
ALT: 11 U/L (ref 6–29)
AST: 13 U/L (ref 10–35)
Albumin: 4.4 g/dL (ref 3.6–5.1)
Alkaline phosphatase (APISO): 67 U/L (ref 31–125)
BUN: 11 mg/dL (ref 7–25)
CO2: 25 mmol/L (ref 20–32)
Calcium: 9.4 mg/dL (ref 8.6–10.2)
Chloride: 104 mmol/L (ref 98–110)
Creat: 0.66 mg/dL (ref 0.50–0.99)
Globulin: 2.8 g/dL (ref 1.9–3.7)
Glucose, Bld: 92 mg/dL (ref 65–99)
Potassium: 4.6 mmol/L (ref 3.5–5.3)
Sodium: 139 mmol/L (ref 135–146)
Total Bilirubin: 0.6 mg/dL (ref 0.2–1.2)
Total Protein: 7.2 g/dL (ref 6.1–8.1)
eGFR: 109 mL/min/1.73m2 (ref >=60)

## 2024-03-30 LAB — HEMOGLOBIN A1C
Hgb A1c MFr Bld: 5.3 % (ref <5.7)
Mean Plasma Glucose: 105 mg/dL
eAG (mmol/L): 5.8 mmol/L

## 2024-04-01 ENCOUNTER — Ambulatory Visit: Payer: Self-pay | Admitting: Internal Medicine

## 2024-04-10 ENCOUNTER — Other Ambulatory Visit: Payer: Self-pay | Admitting: Medical Genetics

## 2024-04-11 ENCOUNTER — Other Ambulatory Visit
Admission: RE | Admit: 2024-04-11 | Discharge: 2024-04-11 | Disposition: A | Discharge status: Home / Self Care | Payer: Self-pay | Source: Ambulatory Visit | Attending: Medical Genetics | Admitting: Medical Genetics

## 2024-04-21 LAB — GENECONNECT MOLECULAR SCREEN: Genetic Analysis Overall Interpretation: NEGATIVE

## 2024-07-03 ENCOUNTER — Ambulatory Visit
Admission: RE | Admit: 2024-07-03 | Discharge: 2024-07-03 | Disposition: A | Discharge status: Home / Self Care | Source: Ambulatory Visit | Attending: Internal Medicine | Admitting: Internal Medicine

## 2024-07-03 DIAGNOSIS — Z1231 Encounter for screening mammogram for malignant neoplasm of breast: Secondary | ICD-10-CM | POA: Diagnosis not present

## 2024-07-03 DIAGNOSIS — Z0000 Encounter for general adult medical examination without abnormal findings: Secondary | ICD-10-CM | POA: Diagnosis not present

## 2025-03-28 ENCOUNTER — Ambulatory Visit: Admitting: Internal Medicine
